# Patient Record
Sex: Male | Born: 1965 | Race: White | Hispanic: No | Marital: Married | Smoking: Never smoker
Health system: Southern US, Community
[De-identification: ages and names within clinical notes are randomized; demographics above are authoritative.]

## PROBLEM LIST (undated history)

## (undated) DIAGNOSIS — I10 Essential (primary) hypertension: Secondary | ICD-10-CM

## (undated) DIAGNOSIS — E119 Type 2 diabetes mellitus without complications: Secondary | ICD-10-CM

## (undated) DIAGNOSIS — E785 Hyperlipidemia, unspecified: Secondary | ICD-10-CM

## (undated) HISTORY — PX: OTHER SURGICAL HISTORY: SHX169

## (undated) HISTORY — DX: Essential (primary) hypertension: I10

## (undated) HISTORY — DX: Type 2 diabetes mellitus without complications: E11.9

## (undated) HISTORY — DX: Hyperlipidemia, unspecified: E78.5

---

## 1998-03-17 ENCOUNTER — Emergency Department (HOSPITAL_COMMUNITY): Admission: EM | Admit: 1998-03-17 | Discharge: 1998-03-17 | Payer: Self-pay | Admitting: Emergency Medicine

## 2020-06-17 ENCOUNTER — Observation Stay (HOSPITAL_COMMUNITY)
Admission: EM | Admit: 2020-06-17 | Discharge: 2020-06-19 | Disposition: A | Discharge status: Home / Self Care | Payer: PRIVATE HEALTH INSURANCE | Attending: Internal Medicine | Admitting: Internal Medicine

## 2020-06-17 ENCOUNTER — Emergency Department (HOSPITAL_COMMUNITY): Payer: PRIVATE HEALTH INSURANCE

## 2020-06-17 ENCOUNTER — Other Ambulatory Visit: Payer: Self-pay

## 2020-06-17 DIAGNOSIS — I6330 Cerebral infarction due to thrombosis of unspecified cerebral artery: Secondary | ICD-10-CM | POA: Insufficient documentation

## 2020-06-17 DIAGNOSIS — R29818 Other symptoms and signs involving the nervous system: Secondary | ICD-10-CM | POA: Diagnosis present

## 2020-06-17 DIAGNOSIS — R29810 Facial weakness: Secondary | ICD-10-CM

## 2020-06-17 DIAGNOSIS — R55 Syncope and collapse: Secondary | ICD-10-CM | POA: Diagnosis present

## 2020-06-17 DIAGNOSIS — Z794 Long term (current) use of insulin: Secondary | ICD-10-CM | POA: Diagnosis not present

## 2020-06-17 DIAGNOSIS — G459 Transient cerebral ischemic attack, unspecified: Secondary | ICD-10-CM | POA: Diagnosis not present

## 2020-06-17 DIAGNOSIS — Z20822 Contact with and (suspected) exposure to covid-19: Secondary | ICD-10-CM | POA: Insufficient documentation

## 2020-06-17 DIAGNOSIS — Z79899 Other long term (current) drug therapy: Secondary | ICD-10-CM | POA: Insufficient documentation

## 2020-06-17 DIAGNOSIS — R079 Chest pain, unspecified: Secondary | ICD-10-CM | POA: Diagnosis not present

## 2020-06-17 DIAGNOSIS — I10 Essential (primary) hypertension: Secondary | ICD-10-CM | POA: Insufficient documentation

## 2020-06-17 DIAGNOSIS — R072 Precordial pain: Secondary | ICD-10-CM | POA: Diagnosis not present

## 2020-06-17 DIAGNOSIS — R42 Dizziness and giddiness: Secondary | ICD-10-CM | POA: Diagnosis present

## 2020-06-17 DIAGNOSIS — R17 Unspecified jaundice: Secondary | ICD-10-CM | POA: Diagnosis present

## 2020-06-17 DIAGNOSIS — R531 Weakness: Secondary | ICD-10-CM

## 2020-06-17 DIAGNOSIS — E1165 Type 2 diabetes mellitus with hyperglycemia: Secondary | ICD-10-CM | POA: Diagnosis present

## 2020-06-17 LAB — CBC
HCT: 46.9 % (ref 39.0–52.0)
Hemoglobin: 15.4 g/dL (ref 13.0–17.0)
MCH: 30.6 pg (ref 26.0–34.0)
MCHC: 32.8 g/dL (ref 30.0–36.0)
MCV: 93.1 fL (ref 80.0–100.0)
Platelets: 244 10*3/uL (ref 150–400)
RBC: 5.04 MIL/uL (ref 4.22–5.81)
RDW: 12.2 % (ref 11.5–15.5)
WBC: 16.3 10*3/uL — ABNORMAL HIGH (ref 4.0–10.5)
nRBC: 0 % (ref 0.0–0.2)

## 2020-06-17 LAB — DIFFERENTIAL
Abs Immature Granulocytes: 0.07 10*3/uL (ref 0.00–0.07)
Basophils Absolute: 0.1 10*3/uL (ref 0.0–0.1)
Basophils Relative: 0 %
Eosinophils Absolute: 0.1 10*3/uL (ref 0.0–0.5)
Eosinophils Relative: 1 %
Immature Granulocytes: 0 %
Lymphocytes Relative: 8 %
Lymphs Abs: 1.3 10*3/uL (ref 0.7–4.0)
Monocytes Absolute: 0.9 10*3/uL (ref 0.1–1.0)
Monocytes Relative: 6 %
Neutro Abs: 13.9 10*3/uL — ABNORMAL HIGH (ref 1.7–7.7)
Neutrophils Relative %: 85 %

## 2020-06-17 LAB — I-STAT CHEM 8, ED
BUN: 19 mg/dL (ref 6–20)
Calcium, Ion: 1.19 mmol/L (ref 1.15–1.40)
Chloride: 101 mmol/L (ref 98–111)
Creatinine, Ser: 0.9 mg/dL (ref 0.61–1.24)
Glucose, Bld: 178 mg/dL — ABNORMAL HIGH (ref 70–99)
HCT: 45 % (ref 39.0–52.0)
Hemoglobin: 15.3 g/dL (ref 13.0–17.0)
Potassium: 3.9 mmol/L (ref 3.5–5.1)
Sodium: 138 mmol/L (ref 135–145)
TCO2: 25 mmol/L (ref 22–32)

## 2020-06-17 LAB — COMPREHENSIVE METABOLIC PANEL
ALT: 19 U/L (ref 0–44)
AST: 19 U/L (ref 15–41)
Albumin: 4.2 g/dL (ref 3.5–5.0)
Alkaline Phosphatase: 42 U/L (ref 38–126)
Anion gap: 10 (ref 5–15)
BUN: 16 mg/dL (ref 6–20)
CO2: 24 mmol/L (ref 22–32)
Calcium: 9.1 mg/dL (ref 8.9–10.3)
Chloride: 101 mmol/L (ref 98–111)
Creatinine, Ser: 0.95 mg/dL (ref 0.61–1.24)
GFR, Estimated: >60 mL/min (ref >60)
Glucose, Bld: 183 mg/dL — ABNORMAL HIGH (ref 70–99)
Potassium: 3.9 mmol/L (ref 3.5–5.1)
Sodium: 135 mmol/L (ref 135–145)
Total Bilirubin: 2.1 mg/dL — ABNORMAL HIGH (ref 0.3–1.2)
Total Protein: 7 g/dL (ref 6.5–8.1)

## 2020-06-17 LAB — APTT: aPTT: 21 seconds — ABNORMAL LOW (ref 24–36)

## 2020-06-17 LAB — TROPONIN I (HIGH SENSITIVITY): Troponin I (High Sensitivity): 4 ng/L (ref <18)

## 2020-06-17 LAB — PROTIME-INR
INR: 1 (ref 0.8–1.2)
Prothrombin Time: 13.5 seconds (ref 11.4–15.2)

## 2020-06-17 LAB — D-DIMER, QUANTITATIVE: D-Dimer, Quant: 0.36 ug/mL-FEU (ref 0.00–0.50)

## 2020-06-17 LAB — CBG MONITORING, ED: Glucose-Capillary: 168 mg/dL — ABNORMAL HIGH (ref 70–99)

## 2020-06-17 MED ORDER — IOHEXOL 350 MG/ML SOLN
75.0000 mL | Freq: Once | INTRAVENOUS | Status: AC | PRN
  Administered 2020-06-17: 75 mL via INTRAVENOUS

## 2020-06-17 MED ORDER — SODIUM CHLORIDE 0.9 % IV SOLN: Freq: Once | INTRAVENOUS | Status: AC

## 2020-06-17 MED ORDER — SODIUM CHLORIDE 0.9% FLUSH: 3.0000 mL | Freq: Once | INTRAVENOUS | Status: DC

## 2020-06-17 MED ORDER — SODIUM CHLORIDE 0.9 % IV SOLN
25.0000 mg | Freq: Once | INTRAVENOUS | Status: AC
  Administered 2020-06-17: 25 mg via INTRAVENOUS
  Filled 2020-06-17: qty 1

## 2020-06-17 NOTE — Code Documentation (Signed)
Responded to Code Stroke called at 2032 for L facial droop, LSN-2000. Pt arrived at 2038 c/o SOB/HA/nausea, CBG-168, NIH-1, CT head negative for acute changes. Pt transported to ED room for workup.

## 2020-06-17 NOTE — ED Provider Notes (Incomplete)
[facility] EMERGENCY DEPARTMENT Provider Note   CSN: [id number] Arrival date & time: 06/17/20  2038  An emergency department physician performed an initial assessment on this suspected stroke patient at 2041.  History Chief Complaint  Patient presents with  . Code Stroke    Eric Bauer is a 55 y.o. male.  Patient with h/o DM Type 2, hypertension, high cholesterol -- presents by EMS from a restaurant as a Code Stroke. Pt had abrupt onset of fatigue, lightheadedness. This was associated with nausea without vomiting, headache, shortness of breath, and chest pain with breathing. EMS was called. They noted left-sided facial droop, prompting code stroke. Pt met by myself and neuro on arrival to ED. Head CT was negative for bleed. No treatments other than IV fluids PTA. Patient denies risk factors for pulmonary embolism or DVT including: unilateral leg swelling, history of DVT/PE/other blood clots, use of exogenous hormones, recent immobilizations, recent surgery, recent travel (>4hr segment), malignancy, hemoptysis. No h/o cardiac disease.          No past medical history on file.  There are no problems to display for this patient.   *** The histories are not reviewed yet. Please review them in the "History" navigator section and refresh this [place].     No family history on file.     Home Medications Prior to Admission medications   Not on File    Allergies    Patient has no allergy information on record.  Review of Systems   Review of Systems  Constitutional: Positive for fatigue. Negative for diaphoresis and fever.  Eyes: Negative for redness.  Respiratory: Positive for shortness of breath. Negative for cough.   Cardiovascular: Positive for chest pain. Negative for palpitations and leg swelling.  Gastrointestinal: Positive for nausea. Negative for abdominal pain and vomiting.  Genitourinary: Negative for dysuria.  Musculoskeletal: Negative  for back pain and neck pain.  Skin: Negative for rash.  Neurological: Positive for dizziness, facial asymmetry, light-headedness and headaches. Negative for syncope.  Psychiatric/Behavioral: The patient is not nervous/anxious.     Physical Exam Updated Vital Signs BP 124/86   Pulse (!) 109   Temp 98.4 F (36.9 C) (Oral)   Resp (!) 25   Ht '5\' 8"'  (1.727 m)   Wt 91.8 kg   SpO2 99%   BMI 30.77 kg/m   Physical Exam Vitals and nursing note reviewed.  Constitutional:      Appearance: He is well-developed. He is not diaphoretic.  HENT:     Head: Normocephalic and atraumatic.     Right Ear: Tympanic membrane, ear canal and external ear normal.     Left Ear: Tympanic membrane, ear canal and external ear normal.     Nose: Nose normal.     Mouth/Throat:     Mouth: Mucous membranes are not dry.     Pharynx: Uvula midline.  Eyes:     General: Lids are normal.     Conjunctiva/sclera: Conjunctivae normal.     Pupils: Pupils are equal, round, and reactive to light.  Neck:     Vascular: Normal carotid pulses. No carotid bruit or JVD.     Trachea: Trachea normal. No tracheal deviation.  Cardiovascular:     Rate and Rhythm: Regular rhythm. Tachycardia present.     Pulses: No decreased pulses.     Heart sounds: Normal heart sounds, S1 normal and S2 normal. Heart sounds not distant. No murmur heard.     Comments: HR 100-105 Pulmonary:  Effort: Pulmonary effort is normal. No respiratory distress.     Breath sounds: Normal breath sounds. No wheezing.  Chest:     Chest wall: No tenderness.  Abdominal:     General: Bowel sounds are normal.     Palpations: Abdomen is soft.     Tenderness: There is no abdominal tenderness. There is no guarding or rebound.  Musculoskeletal:        General: Normal range of motion.     Cervical back: Normal range of motion and neck supple. No tenderness or bony tenderness. No muscular tenderness.     Right lower leg: No edema.     Left lower leg: No  edema.     Comments: No calf/thigh tenderness or swelling.   Skin:    General: Skin is warm and dry.     Coloration: Skin is not pale.  Neurological:     Mental Status: He is alert and oriented to person, place, and time.     GCS: GCS eye subscore is 4. GCS verbal subscore is 5. GCS motor subscore is 6.     Cranial Nerves: Cranial nerves are intact. No cranial nerve deficit, dysarthria or facial asymmetry.     Sensory: No sensory deficit.     Motor: Weakness (generalized, no focal unilateral weakness) present. No abnormal muscle tone.     Coordination: Coordination is intact. Romberg sign negative. Coordination normal.     ED Results / Procedures / Treatments   Labs (all labs ordered are listed, but only abnormal results are displayed) Labs Reviewed  APTT - Abnormal; Notable for the following components:      Result Value   aPTT 21 (*)    All other components within normal limits  CBC - Abnormal; Notable for the following components:   WBC 16.3 (*)    All other components within normal limits  DIFFERENTIAL - Abnormal; Notable for the following components:   Neutro Abs 13.9 (*)    All other components within normal limits  COMPREHENSIVE METABOLIC PANEL - Abnormal; Notable for the following components:   Glucose, Bld 183 (*)    Total Bilirubin 2.1 (*)    All other components within normal limits  I-STAT CHEM 8, ED - Abnormal; Notable for the following components:   Glucose, Bld 178 (*)    All other components within normal limits  CBG MONITORING, ED - Abnormal; Notable for the following components:   Glucose-Capillary 168 (*)    All other components within normal limits  PROTIME-INR  D-DIMER, QUANTITATIVE  URINALYSIS, ROUTINE W REFLEX MICROSCOPIC  TROPONIN I (HIGH SENSITIVITY)    EKG EKG Interpretation  Date/Time:  Saturday June 17 2020 20:59:46 EDT Ventricular Rate:  92 PR Interval:  146 QRS Duration: 86 QT Interval:  339 QTC Calculation: 420 R Axis:   55 Text  Interpretation: Sinus rhythm ST elev, probable normal early repol pattern Confirmed by Thamas Jaegers ([id number]) on 06/17/2020 9:53:13 PM   Radiology DG Chest Port 1 View  Result Date: 06/17/2020 CLINICAL DATA:  Shortness of breath EXAM: PORTABLE CHEST 1 VIEW COMPARISON:  None. FINDINGS: The heart size and mediastinal contours are within normal limits. Low lung volumes with bibasilar atelectasis. No focal consolidation. No visible pleural effusion or pneumothorax. The visualized skeletal structures are unremarkable. IMPRESSION: Low lung volumes with bibasilar atelectasis. No focal consolidation. Electronically Signed   By: Dahlia Bailiff MD   On: 06/17/2020 21:44   CT HEAD CODE STROKE WO CONTRAST  Result Date: 06/17/2020 CLINICAL  DATA:  Code stroke. EXAM: CT HEAD WITHOUT CONTRAST TECHNIQUE: Contiguous axial images were obtained from the base of the skull through the vertex without intravenous contrast. COMPARISON:  Head CT 07/18/2019 FINDINGS: Brain: There is no mass, hemorrhage or extra-axial collection. The size and configuration of the ventricles and extra-axial CSF spaces are normal. The brain parenchyma is normal, without evidence of acute or chronic infarction. Vascular: No abnormal hyperdensity of the major intracranial arteries or dural venous sinuses. No intracranial atherosclerosis. Skull: The visualized skull base, calvarium and extracranial soft tissues are normal. Sinuses/Orbits: No fluid levels or advanced mucosal thickening of the visualized paranasal sinuses. No mastoid or middle ear effusion. The orbits are normal. ASPECTS [facility] Stroke Program Early CT Score) - Ganglionic level infarction (caudate, lentiform nuclei, internal capsule, insula, M1-M3 cortex): 7 - Supraganglionic infarction (M4-M6 cortex): 3 Total score (0-10 with 10 being normal): 10 IMPRESSION: 1. Normal head CT. 2. ASPECTS is 10. These results were communicated to Dr. Kerney Elbe at 8:59 pm on 06/17/2020 by text page via the  [facility] messaging system. Electronically Signed   By: Ulyses Jarred M.D.   On: 06/17/2020 21:00    Procedures Procedures {Remember to document critical care time when appropriate:1}  Medications Ordered in ED Medications  sodium chloride flush (NS) 0.9 % injection 3 mL (3 mLs Intravenous Not Given 06/17/20 2106)  promethazine (PHENERGAN) 25 mg in sodium chloride 0.9 % 50 mL IVPB (0 mg Intravenous Stopped 06/17/20 2227)  0.9 %  sodium chloride infusion ( Intravenous New Bag/Given 06/17/20 2126)  iohexol (OMNIPAQUE) 350 MG/ML injection 75 mL (75 mLs Intravenous Contrast Given 06/17/20 2340)    ED Course  I have reviewed the triage vital signs and the nursing notes.  Pertinent labs & imaging results that were available during my care of the patient were reviewed by me and considered in my medical decision making (see chart for details).  Patient seen and examined. Work-up initiated.    Vital signs reviewed and are as follows: BP 124/86   Pulse (!) 109   Temp 98.4 F (36.9 C) (Oral)   Resp (!) 25   Ht '5\' 8"'  (1.727 m)   Wt 91.8 kg   SpO2 99%   BMI 30.77 kg/m   EKG reviewed, no STEMI.   11:11 PM Pt stable. Continues to c/o pain with deep breathing and mild SOB. D-dimer added and was negative. Awaiting troponin.   Neuro reccs admission for TIA work-up given risk factors.   12:02 AM Troponin is 4. Will request consult for admission.     MDM Rules/Calculators/A&P                          Admit for TIA/CP eval.   Final Clinical Impression(s) / ED Diagnoses Final diagnoses:  Facial droop  Precordial pain  Generalized weakness    Rx / DC Orders ED Discharge Orders    None

## 2020-06-17 NOTE — ED Triage Notes (Signed)
[place] [facility] [place] [facility]. Felt fatigue with near syncope. No trauma. Started having nausea, SOB, and headache. BP 109/68 fluids given. L sided facial droop noticed. NO blood thinners, no hx of stroke.   132/78 98%  96 heart rate

## 2020-06-17 NOTE — ED Provider Notes (Signed)
[facility] EMERGENCY DEPARTMENT Provider Note   CSN: [id number] Arrival date & time: 06/17/20  2038  An emergency department physician performed an initial assessment on this suspected stroke patient at 2041.  History Chief Complaint  Patient presents with  . Code Stroke    Eric Bauer is a 55 y.o. male.  Patient with h/o DM Type 2, hypertension, high cholesterol -- presents by EMS from a restaurant as a Code Stroke. Pt had abrupt onset of fatigue, lightheadedness. This was associated with nausea without vomiting, headache, shortness of breath, and chest pain with breathing. EMS was called. They noted left-sided facial droop, prompting code stroke. Pt met by myself and neuro on arrival to ED. Head CT was negative for bleed. No treatments other than IV fluids PTA. Patient denies risk factors for pulmonary embolism or DVT including: unilateral leg swelling, history of DVT/PE/other blood clots, use of exogenous hormones, recent immobilizations, recent surgery, recent travel (>4hr segment), malignancy, hemoptysis. No h/o cardiac disease.          No past medical history on file.  There are no problems to display for this patient.   The histories are not reviewed yet. Please review them in the "History" navigator section and refresh this [place].     No family history on file.     Home Medications Prior to Admission medications   Not on File    Allergies    Patient has no allergy information on record.  Review of Systems   Review of Systems  Constitutional: Positive for fatigue. Negative for diaphoresis and fever.  Eyes: Negative for redness.  Respiratory: Positive for shortness of breath. Negative for cough.   Cardiovascular: Positive for chest pain. Negative for palpitations and leg swelling.  Gastrointestinal: Positive for nausea. Negative for abdominal pain and vomiting.  Genitourinary: Negative for dysuria.  Musculoskeletal: Negative for  back pain and neck pain.  Skin: Negative for rash.  Neurological: Positive for dizziness, facial asymmetry, light-headedness and headaches. Negative for syncope.  Psychiatric/Behavioral: The patient is not nervous/anxious.     Physical Exam Updated Vital Signs BP 124/86   Pulse (!) 109   Temp 98.4 F (36.9 C) (Oral)   Resp (!) 25   Ht _0  (1.727 m)   Wt 91.8 kg   SpO2 99%   BMI 30.77 kg/m   Physical Exam Vitals and nursing note reviewed.  Constitutional:      Appearance: He is well-developed. He is not diaphoretic.  HENT:     Head: Normocephalic and atraumatic.     Right Ear: Tympanic membrane, ear canal and external ear normal.     Left Ear: Tympanic membrane, ear canal and external ear normal.     Nose: Nose normal.     Mouth/Throat:     Mouth: Mucous membranes are not dry.     Pharynx: Uvula midline.  Eyes:     General: Lids are normal.     Conjunctiva/sclera: Conjunctivae normal.     Pupils: Pupils are equal, round, and reactive to light.  Neck:     Vascular: Normal carotid pulses. No carotid bruit or JVD.     Trachea: Trachea normal. No tracheal deviation.  Cardiovascular:     Rate and Rhythm: Regular rhythm. Tachycardia present.     Pulses: No decreased pulses.     Heart sounds: Normal heart sounds, S1 normal and S2 normal. Heart sounds not distant. No murmur heard.     Comments: HR 100-105 Pulmonary:  Effort: Pulmonary effort is normal. No respiratory distress.     Breath sounds: Normal breath sounds. No wheezing.  Chest:     Chest wall: No tenderness.  Abdominal:     General: Bowel sounds are normal.     Palpations: Abdomen is soft.     Tenderness: There is no abdominal tenderness. There is no guarding or rebound.  Musculoskeletal:        General: Normal range of motion.     Cervical back: Normal range of motion and neck supple. No tenderness or bony tenderness. No muscular tenderness.     Right lower leg: No edema.     Left lower leg: No edema.      Comments: No calf/thigh tenderness or swelling.   Skin:    General: Skin is warm and dry.     Coloration: Skin is not pale.  Neurological:     Mental Status: He is alert and oriented to person, place, and time.     GCS: GCS eye subscore is 4. GCS verbal subscore is 5. GCS motor subscore is 6.     Cranial Nerves: Cranial nerves are intact. No cranial nerve deficit, dysarthria or facial asymmetry.     Sensory: No sensory deficit.     Motor: Weakness (generalized, no focal unilateral weakness) present. No abnormal muscle tone.     Coordination: Coordination is intact. Romberg sign negative. Coordination normal.     ED Results / Procedures / Treatments   Labs (all labs ordered are listed, but only abnormal results are displayed) Labs Reviewed  APTT - Abnormal; Notable for the following components:      Result Value   aPTT 21 (*)    All other components within normal limits  CBC - Abnormal; Notable for the following components:   WBC 16.3 (*)    All other components within normal limits  DIFFERENTIAL - Abnormal; Notable for the following components:   Neutro Abs 13.9 (*)    All other components within normal limits  COMPREHENSIVE METABOLIC PANEL - Abnormal; Notable for the following components:   Glucose, Bld 183 (*)    Total Bilirubin 2.1 (*)    All other components within normal limits  I-STAT CHEM 8, ED - Abnormal; Notable for the following components:   Glucose, Bld 178 (*)    All other components within normal limits  CBG MONITORING, ED - Abnormal; Notable for the following components:   Glucose-Capillary 168 (*)    All other components within normal limits  PROTIME-INR  D-DIMER, QUANTITATIVE  URINALYSIS, ROUTINE W REFLEX MICROSCOPIC  TROPONIN I (HIGH SENSITIVITY)    EKG EKG Interpretation  Date/Time:  Saturday June 17 2020 20:59:46 EDT Ventricular Rate:  92 PR Interval:  146 QRS Duration: 86 QT Interval:  339 QTC Calculation: 420 R Axis:   55 Text  Interpretation: Sinus rhythm ST elev, probable normal early repol pattern Confirmed by Thamas Jaegers ([id number]) on 06/17/2020 9:53:13 PM   Radiology CT Angio Head W or Wo Contrast  Result Date: 06/17/2020 CLINICAL DATA:  Stroke/TIA, assess intracranial arteries; Stroke/TIA, assess intracranial arteries Stroke/TIA, assess extracranial arteries EXAM: CT ANGIOGRAPHY HEAD AND NECK TECHNIQUE: Multidetector CT imaging of the head and neck was performed using the standard protocol during bolus administration of intravenous contrast. Multiplanar CT image reconstructions and MIPs were obtained to evaluate the vascular anatomy. Carotid stenosis measurements (when applicable) are obtained utilizing NASCET criteria, using the distal internal carotid diameter as the denominator. CONTRAST:  91m OMNIPAQUE IOHEXOL 350 MG/ML  SOLN COMPARISON:  None. FINDINGS: CTA NECK FINDINGS SKELETON: There is no bony spinal canal stenosis. No lytic or blastic lesion. OTHER NECK: Normal pharynx, larynx and major salivary glands. No cervical lymphadenopathy. Unremarkable thyroid gland. UPPER CHEST: No pneumothorax or pleural effusion. No nodules or masses. AORTIC ARCH: There is no calcific atherosclerosis of the aortic arch. There is no aneurysm, dissection or hemodynamically significant stenosis of the visualized portion of the aorta. Conventional 3 vessel aortic branching pattern. The visualized proximal subclavian arteries are widely patent. RIGHT CAROTID SYSTEM: Normal without aneurysm, dissection or stenosis. LEFT CAROTID SYSTEM: Normal without aneurysm, dissection or stenosis. VERTEBRAL ARTERIES: Left dominant configuration. Both origins are clearly patent. There is no dissection, occlusion or flow-limiting stenosis to the skull base (V1-V3 segments). CTA HEAD FINDINGS POSTERIOR CIRCULATION: --Vertebral arteries: Normal V4 segments. --Inferior cerebellar arteries: Normal. --Basilar artery: Normal. --Superior cerebellar arteries: Normal.  --Posterior cerebral arteries (PCA): Normal. The right PCA is partially supplied by a posterior communicating artery (p-comm). ANTERIOR CIRCULATION: --Intracranial internal carotid arteries: Normal. --Anterior cerebral arteries (ACA): Normal. Both A1 segments are present. Patent anterior communicating artery (a-comm). --Middle cerebral arteries (MCA): Normal. VENOUS SINUSES: As permitted by contrast timing, patent. ANATOMIC VARIANTS: None Review of the MIP images confirms the above findings. IMPRESSION: Normal CTA of the head and neck. Electronically Signed   By: Ulyses Jarred M.D.   On: 06/17/2020 23:55   CT Angio Neck W and/or Wo Contrast  Result Date: 06/17/2020 CLINICAL DATA:  Stroke/TIA, assess intracranial arteries; Stroke/TIA, assess intracranial arteries Stroke/TIA, assess extracranial arteries EXAM: CT ANGIOGRAPHY HEAD AND NECK TECHNIQUE: Multidetector CT imaging of the head and neck was performed using the standard protocol during bolus administration of intravenous contrast. Multiplanar CT image reconstructions and MIPs were obtained to evaluate the vascular anatomy. Carotid stenosis measurements (when applicable) are obtained utilizing NASCET criteria, using the distal internal carotid diameter as the denominator. CONTRAST:  51m OMNIPAQUE IOHEXOL 350 MG/ML SOLN COMPARISON:  None. FINDINGS: CTA NECK FINDINGS SKELETON: There is no bony spinal canal stenosis. No lytic or blastic lesion. OTHER NECK: Normal pharynx, larynx and major salivary glands. No cervical lymphadenopathy. Unremarkable thyroid gland. UPPER CHEST: No pneumothorax or pleural effusion. No nodules or masses. AORTIC ARCH: There is no calcific atherosclerosis of the aortic arch. There is no aneurysm, dissection or hemodynamically significant stenosis of the visualized portion of the aorta. Conventional 3 vessel aortic branching pattern. The visualized proximal subclavian arteries are widely patent. RIGHT CAROTID SYSTEM: Normal without  aneurysm, dissection or stenosis. LEFT CAROTID SYSTEM: Normal without aneurysm, dissection or stenosis. VERTEBRAL ARTERIES: Left dominant configuration. Both origins are clearly patent. There is no dissection, occlusion or flow-limiting stenosis to the skull base (V1-V3 segments). CTA HEAD FINDINGS POSTERIOR CIRCULATION: --Vertebral arteries: Normal V4 segments. --Inferior cerebellar arteries: Normal. --Basilar artery: Normal. --Superior cerebellar arteries: Normal. --Posterior cerebral arteries (PCA): Normal. The right PCA is partially supplied by a posterior communicating artery (p-comm). ANTERIOR CIRCULATION: --Intracranial internal carotid arteries: Normal. --Anterior cerebral arteries (ACA): Normal. Both A1 segments are present. Patent anterior communicating artery (a-comm). --Middle cerebral arteries (MCA): Normal. VENOUS SINUSES: As permitted by contrast timing, patent. ANATOMIC VARIANTS: None Review of the MIP images confirms the above findings. IMPRESSION: Normal CTA of the head and neck. Electronically Signed   By: KUlyses JarredM.D.   On: 06/17/2020 23:55   DG Chest Port 1 View  Result Date: 06/17/2020 CLINICAL DATA:  Shortness of breath EXAM: PORTABLE CHEST 1 VIEW COMPARISON:  None. FINDINGS: The heart size and  mediastinal contours are within normal limits. Low lung volumes with bibasilar atelectasis. No focal consolidation. No visible pleural effusion or pneumothorax. The visualized skeletal structures are unremarkable. IMPRESSION: Low lung volumes with bibasilar atelectasis. No focal consolidation. Electronically Signed   By: Dahlia Bailiff MD   On: 06/17/2020 21:44   CT HEAD CODE STROKE WO CONTRAST  Result Date: 06/17/2020 CLINICAL DATA:  Code stroke. EXAM: CT HEAD WITHOUT CONTRAST TECHNIQUE: Contiguous axial images were obtained from the base of the skull through the vertex without intravenous contrast. COMPARISON:  Head CT 07/18/2019 FINDINGS: Brain: There is no mass, hemorrhage or  extra-axial collection. The size and configuration of the ventricles and extra-axial CSF spaces are normal. The brain parenchyma is normal, without evidence of acute or chronic infarction. Vascular: No abnormal hyperdensity of the major intracranial arteries or dural venous sinuses. No intracranial atherosclerosis. Skull: The visualized skull base, calvarium and extracranial soft tissues are normal. Sinuses/Orbits: No fluid levels or advanced mucosal thickening of the visualized paranasal sinuses. No mastoid or middle ear effusion. The orbits are normal. ASPECTS [facility] Stroke Program Early CT Score) - Ganglionic level infarction (caudate, lentiform nuclei, internal capsule, insula, M1-M3 cortex): 7 - Supraganglionic infarction (M4-M6 cortex): 3 Total score (0-10 with 10 being normal): 10 IMPRESSION: 1. Normal head CT. 2. ASPECTS is 10. These results were communicated to Dr. Kerney Elbe at 8:59 pm on 06/17/2020 by text page via the [facility] messaging system. Electronically Signed   By: Ulyses Jarred M.D.   On: 06/17/2020 21:00    Procedures Procedures   Medications Ordered in ED Medications  sodium chloride flush (NS) 0.9 % injection 3 mL (3 mLs Intravenous Not Given 06/17/20 2106)  promethazine (PHENERGAN) 25 mg in sodium chloride 0.9 % 50 mL IVPB (0 mg Intravenous Stopped 06/17/20 2227)  0.9 %  sodium chloride infusion ( Intravenous New Bag/Given 06/17/20 2126)  iohexol (OMNIPAQUE) 350 MG/ML injection 75 mL (75 mLs Intravenous Contrast Given 06/17/20 2340)    ED Course  I have reviewed the triage vital signs and the nursing notes.  Pertinent labs & imaging results that were available during my care of the patient were reviewed by me and considered in my medical decision making (see chart for details).  Patient seen and examined. Work-up initiated.    Vital signs reviewed and are as follows: BP 124/86   Pulse (!) 109   Temp 98.4 F (36.9 C) (Oral)   Resp (!) 25   Ht _0  (1.727 m)   Wt 91.8  kg   SpO2 99%   BMI 30.77 kg/m   EKG reviewed, no STEMI.   11:11 PM Pt stable. Continues to c/o pain with deep breathing and mild SOB. D-dimer added and was negative. Awaiting troponin.   Neuro reccs admission for TIA work-up given risk factors.   12:02 AM Troponin is 4. Will request consult for admission.   12:22 AM Spoke with Dr. Myna Hidalgo who will see.     MDM Rules/Calculators/A&P                          Admit for TIA/CP eval.   Final Clinical Impression(s) / ED Diagnoses Final diagnoses:  Facial droop  Precordial pain  Generalized weakness    Rx / DC Orders ED Discharge Orders    None       Carlisle Cater, Hershal Coria 06/18/20 0023    Luna Fuse, MD 06/18/20 1511

## 2020-06-17 NOTE — ED Notes (Signed)
Pt placed on 2L [place] for comfort.

## 2020-06-17 NOTE — ED Notes (Signed)
Per neurology verbal order for troponin add on and let EDP know.

## 2020-06-17 NOTE — ED Notes (Signed)
Called lab to add on d-dimer.  

## 2020-06-17 NOTE — ED Notes (Signed)
Called lab to add on troponin  

## 2020-06-17 NOTE — Consult Note (Signed)
NEURO HOSPITALIST CONSULT NOTE   Requestig physician: Dr. Antionette Char  Reason for Consult: Acute onset of near-syncope with left sided facial droop  History obtained from:  EMS, Patient and Chart     HPI:                                                                                                                                          Eric Bauer is an 55 y.o. male with a PMHx of DM and HTN, presenting acutely from [facility] [facility] via EMS after having a near-syncopal episode in conjunction with diaphoresis and upper extremity tremor prior to eating. He also started to experience nausea, SOB and H/A. BP per EMS was 109/68 and 300 mL fluids given. CBG was 170. HR 97. During their evaluation, EMS noted a left sided facial droop and Code Stroke was called in the field. The patient has no history of stroke. He has had an episode similar to this in the past. On arrival to the ED, BP was 132/78, HR 96 and O2 sats 98% on RA.    No past medical history on file.   No family history on file.           Social History:  has no history on file for tobacco use, alcohol use, and drug use.  Not on File  MEDICATIONS:                                                                                                                     No current facility-administered medications on file prior to encounter.   Current Outpatient Medications on File Prior to Encounter  Medication Sig Dispense Refill  . famotidine (PEPCID) 40 MG tablet Take 40 mg by mouth daily.    Marland Kitchen JANUMET 50-1000 MG tablet Take 1 tablet by mouth 2 (two) times daily with a meal.    . lisinopril (ZESTRIL) 5 MG tablet Take 5 mg by mouth daily.    . rosuvastatin (CRESTOR) 40 MG tablet Take 40 mg by mouth daily.    . tamsulosin (FLOMAX) 0.4 MG CAPS capsule Take 0.8 mg by mouth daily.    [name] FLEXTOUCH 100 UNIT/ML FlexTouch Pen Inject 46 Units into the skin at bedtime.       ROS:  The patient is a poor historian and cannot provide a comprehensive ROS outside of his immediate complaints, as described in the HPI.   Weight 91.8 kg. BP 127/88   Pulse (!) 112   Temp 98.4 F (36.9 C) (Oral)   Resp 18   Ht 5\' 8"  (1.727 m)   Wt 91.8 kg   SpO2 97%   BMI 30.77 kg/m    General Examination:                                                                                                       Physical Exam  HEENT-  [place]/AT    Lungs- Respirations unlabored Extremities- No edema  Neurological Examination Mental Status: Awake but mildly drowsy. Alert, but with decreased attention. Speech fluent with intact comprehension. Able to name a thumb and pinky finger, but not a forefinger. Oriented to city, state, day of the week, year and month.  Cranial Nerves: II: Temporal visual fields intact with no extinction to DSS. PERRL.   III,IV, VI: No ptosis. EOMI. No nystagmus.  V,VII: Smile symmetric, facial temp sensation equal bilaterally VIII: Hearing intact to voice IX,X: No hypophonia XI: Symmetric shoulder shrug XII: Midline tongue extension Motor: LUE and LLE with giveway weakness; maximum strength elicitable is 4+/5 RUE and RLE 5/5 Sensory: Temp and FT intact x 4. No extinction to DSS.  Deep Tendon Reflexes: 2+ and symmetric bilateral brachioradialis and biceps. 3+ bilateral patellae. 1+ bilateral achilles. Toes downgoing bilaterally.  Cerebellar: No ataxia with FNF bilaterally. Action tremor is noted.  Gait: Deferred    Lab Results: Basic Metabolic Panel: No results for input(s): NA, K, CL, CO2, GLUCOSE, BUN, CREATININE, CALCIUM, MG, PHOS in the last 168 hours.  CBC: No results for input(s): WBC, NEUTROABS, HGB, HCT, MCV, PLT in the last 168 hours.  Cardiac Enzymes: No results for input(s): CKTOTAL, CKMB, CKMBINDEX, TROPONINI in the last 168 hours.  Lipid  Panel: No results for input(s): CHOL, TRIG, HDL, CHOLHDL, VLDL, LDLCALC in the last 168 hours.  Imaging: No results found.  Assessment: 55 year old male presenting with acute onset of near-syncope with left sided facial droop 1. Neurological examination reveals giveway weakness on the left. No facial droop noted. DDx includes acute lacunar infarction, right sided cerebral hypoperfusion in the context of near-syncope and low BP on scene, MI (has chest pain with left shoulder pain as well as SOB) and psychogenic pseudostroke. In the context of the wide DDx, potential benefits of IV tPA are significantly outweighed by risks.  2. CT head: Normal 3. CTA of head and neck: Normal 4. Elevated WBC of 16.3.   Recommendations: 1. MRI brain 2. TTE 3. Cardiac telemetry 4. BP monitoring.  5. IVF 6. Permissive HTN x 24 hours 7. Evaluation for the underlying etiology of the patient's leukocytosis 8. ASA 325 mg po x 1, followed by 81 mg po qd 9. PT/OT/Speech 10. Orthostatics. 11. ED evaluation for possible MI.   Electronically signed: Dr. 40 06/17/2020, 8:50 PM

## 2020-06-18 ENCOUNTER — Encounter (HOSPITAL_COMMUNITY): Payer: Self-pay | Admitting: Family Medicine

## 2020-06-18 ENCOUNTER — Observation Stay (HOSPITAL_COMMUNITY): Payer: PRIVATE HEALTH INSURANCE

## 2020-06-18 ENCOUNTER — Observation Stay (HOSPITAL_BASED_OUTPATIENT_CLINIC_OR_DEPARTMENT_OTHER): Payer: PRIVATE HEALTH INSURANCE

## 2020-06-18 DIAGNOSIS — R079 Chest pain, unspecified: Secondary | ICD-10-CM | POA: Diagnosis not present

## 2020-06-18 DIAGNOSIS — R55 Syncope and collapse: Secondary | ICD-10-CM | POA: Diagnosis not present

## 2020-06-18 DIAGNOSIS — G459 Transient cerebral ischemic attack, unspecified: Secondary | ICD-10-CM | POA: Diagnosis not present

## 2020-06-18 DIAGNOSIS — E1165 Type 2 diabetes mellitus with hyperglycemia: Secondary | ICD-10-CM | POA: Diagnosis present

## 2020-06-18 DIAGNOSIS — R17 Unspecified jaundice: Secondary | ICD-10-CM | POA: Diagnosis not present

## 2020-06-18 DIAGNOSIS — I6330 Cerebral infarction due to thrombosis of unspecified cerebral artery: Secondary | ICD-10-CM | POA: Insufficient documentation

## 2020-06-18 DIAGNOSIS — R29818 Other symptoms and signs involving the nervous system: Secondary | ICD-10-CM

## 2020-06-18 LAB — ECHOCARDIOGRAM COMPLETE
Area-P 1/2: 5.54 cm2
Height: 68 in
S' Lateral: 2.7 cm
Weight: 3238.12 oz

## 2020-06-18 LAB — COMPREHENSIVE METABOLIC PANEL
ALT: 17 U/L (ref 0–44)
AST: 18 U/L (ref 15–41)
Albumin: 3.7 g/dL (ref 3.5–5.0)
Alkaline Phosphatase: 38 U/L (ref 38–126)
Anion gap: 9 (ref 5–15)
BUN: 15 mg/dL (ref 6–20)
CO2: 22 mmol/L (ref 22–32)
Calcium: 8.6 mg/dL — ABNORMAL LOW (ref 8.9–10.3)
Chloride: 104 mmol/L (ref 98–111)
Creatinine, Ser: 0.92 mg/dL (ref 0.61–1.24)
GFR, Estimated: >60 mL/min (ref >60)
Glucose, Bld: 196 mg/dL — ABNORMAL HIGH (ref 70–99)
Potassium: 3.9 mmol/L (ref 3.5–5.1)
Sodium: 135 mmol/L (ref 135–145)
Total Bilirubin: 2.1 mg/dL — ABNORMAL HIGH (ref 0.3–1.2)
Total Protein: 6.5 g/dL (ref 6.5–8.1)

## 2020-06-18 LAB — SARS CORONAVIRUS 2 (TAT 6-24 HRS): SARS Coronavirus 2: NEGATIVE

## 2020-06-18 LAB — URINALYSIS, ROUTINE W REFLEX MICROSCOPIC
Bilirubin Urine: NEGATIVE
Glucose, UA: 50 mg/dL — AB
Hgb urine dipstick: NEGATIVE
Ketones, ur: 5 mg/dL — AB
Leukocytes,Ua: NEGATIVE
Nitrite: NEGATIVE
Protein, ur: NEGATIVE mg/dL
Specific Gravity, Urine: 1.034 — ABNORMAL HIGH (ref 1.005–1.030)
pH: 6 (ref 5.0–8.0)

## 2020-06-18 LAB — LIPID PANEL
Cholesterol: 93 mg/dL (ref 0–200)
HDL: 34 mg/dL — ABNORMAL LOW (ref >40)
LDL Cholesterol: 47 mg/dL (ref 0–99)
Total CHOL/HDL Ratio: 2.7 RATIO
Triglycerides: 62 mg/dL (ref <150)
VLDL: 12 mg/dL (ref 0–40)

## 2020-06-18 LAB — GLUCOSE, CAPILLARY
Glucose-Capillary: 160 mg/dL — ABNORMAL HIGH (ref 70–99)
Glucose-Capillary: 137 mg/dL — ABNORMAL HIGH (ref 70–99)
Glucose-Capillary: 182 mg/dL — ABNORMAL HIGH (ref 70–99)
Glucose-Capillary: 127 mg/dL — ABNORMAL HIGH (ref 70–99)

## 2020-06-18 LAB — HIV ANTIBODY (ROUTINE TESTING W REFLEX): HIV Screen 4th Generation wRfx: NONREACTIVE

## 2020-06-18 LAB — CBC
HCT: 44.7 % (ref 39.0–52.0)
Hemoglobin: 15 g/dL (ref 13.0–17.0)
MCH: 30.9 pg (ref 26.0–34.0)
MCHC: 33.6 g/dL (ref 30.0–36.0)
MCV: 92 fL (ref 80.0–100.0)
Platelets: 225 10*3/uL (ref 150–400)
RBC: 4.86 MIL/uL (ref 4.22–5.81)
RDW: 12.2 % (ref 11.5–15.5)
WBC: 15.9 10*3/uL — ABNORMAL HIGH (ref 4.0–10.5)
nRBC: 0 % (ref 0.0–0.2)

## 2020-06-18 LAB — HEMOGLOBIN A1C
Hgb A1c MFr Bld: 9.3 % — ABNORMAL HIGH (ref 4.8–5.6)
Mean Plasma Glucose: 220.21 mg/dL

## 2020-06-18 LAB — TROPONIN I (HIGH SENSITIVITY): Troponin I (High Sensitivity): 3 ng/L (ref <18)

## 2020-06-18 LAB — LIPASE, BLOOD: Lipase: 39 U/L (ref 11–51)

## 2020-06-18 LAB — CBG MONITORING, ED: Glucose-Capillary: 145 mg/dL — ABNORMAL HIGH (ref 70–99)

## 2020-06-18 MED ORDER — INSULIN GLARGINE 100 UNIT/ML ~~LOC~~ SOLN
30.0000 [IU] | Freq: Every day | SUBCUTANEOUS | Status: DC
  Administered 2020-06-18 (×2): 30 [IU] via SUBCUTANEOUS
  Filled 2020-06-18 (×4): qty 0.3

## 2020-06-18 MED ORDER — INSULIN ASPART 100 UNIT/ML ~~LOC~~ SOLN: 0.0000 [IU] | Freq: Every day | SUBCUTANEOUS | Status: DC

## 2020-06-18 MED ORDER — ROSUVASTATIN CALCIUM 20 MG PO TABS
40.0000 mg | ORAL_TABLET | Freq: Every day | ORAL | Status: DC
  Administered 2020-06-18 – 2020-06-19 (×2): 40 mg via ORAL
  Filled 2020-06-18 (×2): qty 2

## 2020-06-18 MED ORDER — ACETAMINOPHEN 325 MG PO TABS
650.0000 mg | ORAL_TABLET | ORAL | Status: DC | PRN
  Administered 2020-06-18: 650 mg via ORAL
  Filled 2020-06-18: qty 2

## 2020-06-18 MED ORDER — SODIUM CHLORIDE 0.9 % IV SOLN: INTRAVENOUS | Status: AC

## 2020-06-18 MED ORDER — ACETAMINOPHEN 650 MG RE SUPP: 650.0000 mg | RECTAL | Status: DC | PRN

## 2020-06-18 MED ORDER — STROKE: EARLY STAGES OF RECOVERY BOOK
Freq: Once | Status: AC
  Filled 2020-06-18: qty 1

## 2020-06-18 MED ORDER — ONDANSETRON HCL 4 MG/2ML IJ SOLN
4.0000 mg | Freq: Four times a day (QID) | INTRAMUSCULAR | Status: DC | PRN
  Administered 2020-06-18: 4 mg via INTRAVENOUS
  Filled 2020-06-18: qty 2

## 2020-06-18 MED ORDER — TAMSULOSIN HCL 0.4 MG PO CAPS
0.8000 mg | ORAL_CAPSULE | Freq: Every day | ORAL | Status: DC
  Administered 2020-06-18 – 2020-06-19 (×2): 0.8 mg via ORAL
  Filled 2020-06-18 (×2): qty 2

## 2020-06-18 MED ORDER — LISINOPRIL 5 MG PO TABS
5.0000 mg | ORAL_TABLET | Freq: Every day | ORAL | Status: DC
  Administered 2020-06-18 – 2020-06-19 (×2): 5 mg via ORAL
  Filled 2020-06-18 (×2): qty 1

## 2020-06-18 MED ORDER — ASPIRIN EC 325 MG PO TBEC
325.0000 mg | DELAYED_RELEASE_TABLET | Freq: Once | ORAL | Status: AC
  Administered 2020-06-18: 325 mg via ORAL
  Filled 2020-06-18: qty 1

## 2020-06-18 MED ORDER — SODIUM CHLORIDE 0.9 % IV BOLUS
1000.0000 mL | Freq: Once | INTRAVENOUS | Status: AC
  Administered 2020-06-18: 1000 mL via INTRAVENOUS

## 2020-06-18 MED ORDER — SENNOSIDES-DOCUSATE SODIUM 8.6-50 MG PO TABS: 1.0000 | ORAL_TABLET | Freq: Every evening | ORAL | Status: DC | PRN

## 2020-06-18 MED ORDER — INSULIN ASPART 100 UNIT/ML ~~LOC~~ SOLN
0.0000 [IU] | Freq: Three times a day (TID) | SUBCUTANEOUS | Status: DC
  Administered 2020-06-18: 2 [IU] via SUBCUTANEOUS
  Administered 2020-06-19: 1 [IU] via SUBCUTANEOUS

## 2020-06-18 MED ORDER — ACETAMINOPHEN 160 MG/5ML PO SOLN: 650.0000 mg | ORAL | Status: DC | PRN

## 2020-06-18 MED ORDER — ENOXAPARIN SODIUM 40 MG/0.4ML ~~LOC~~ SOLN
40.0000 mg | SUBCUTANEOUS | Status: DC
  Administered 2020-06-18 – 2020-06-19 (×2): 40 mg via SUBCUTANEOUS
  Filled 2020-06-18 (×2): qty 0.4

## 2020-06-18 MED ORDER — FAMOTIDINE 20 MG PO TABS
40.0000 mg | ORAL_TABLET | Freq: Every day | ORAL | Status: DC
  Administered 2020-06-18 – 2020-06-19 (×2): 40 mg via ORAL
  Filled 2020-06-18 (×2): qty 2

## 2020-06-18 MED ORDER — ASPIRIN EC 81 MG PO TBEC
81.0000 mg | DELAYED_RELEASE_TABLET | Freq: Every day | ORAL | Status: DC
  Administered 2020-06-19: 81 mg via ORAL
  Filled 2020-06-18: qty 1

## 2020-06-18 NOTE — Progress Notes (Signed)
Neurology Progress Note   S:// Patient is lying in bed in NAD. He states he feels better than yesterday and symptoms have resolved. No family at bedside    O:// Current vital signs: BP 117/69 (BP Location: Right Arm)   Pulse 98   Temp 98.7 F (37.1 C) (Oral)   Resp 18   Ht 5\' 8"  (1.727 m)   Wt 91.8 kg   SpO2 96%   BMI 30.77 kg/m  Vital signs in last 24 hours: Temp:  [98.4 F (36.9 C)-98.7 F (37.1 C)] 98.7 F (37.1 C) (04/24 1006) Pulse Rate:  [91-112] 98 (04/24 1006) Resp:  [16-28] 18 (04/24 1006) BP: (96-128)/(64-88) 117/69 (04/24 1006) SpO2:  [93 %-100 %] 96 % (04/24 1006) Weight:  [91.8 kg] 91.8 kg (04/23 2050)   GENERAL: Awake, alert in NAD HEENT: - Normocephalic and atraumatic, dry mm LUNGS - Clear to auscultation bilaterally with no wheezes CV - S1S2 RRR, no m/r/g, equal pulses bilaterally. ABDOMEN - Soft, nontender, nondistended with normoactive BS Ext: warm, well perfused, intact peripheral pulses, no edema Psych: flat affect   NEURO:  Mental Status: AA&Ox3  Language: speech is clear.  Naming, repetition, fluency, and comprehension intact. Cranial Nerves: PERRL 23mm/brisk. EOMI, visual fields full, no facial asymmetry facial sensation intact, hearing intact, tongue/uvula/soft palate midline, normal sternocleidomastoid and trapezius muscle strength. No evidence of tongue atrophy or fibrillations Motor: 4/5 left leg 5/5 left upper arm, right upper, and right leg Tone: is normal and bulk is normal Sensation- Intact to light touch bilaterally Coordination: FTN intact bilaterally, no ataxia in BLE. Gait- deferred   Medications  Current Facility-Administered Medications:  .  acetaminophen (TYLENOL) tablet 650 mg, 650 mg, Oral, Q4H PRN **OR** acetaminophen (TYLENOL) 160 MG/5ML solution 650 mg, 650 mg, Per Tube, Q4H PRN **OR** acetaminophen (TYLENOL) suppository 650 mg, 650 mg, Rectal, Q4H PRN, Opyd, 3m, MD .  Lavone Neri ON 06/19/2020] aspirin EC tablet 81 mg,  81 mg, Oral, Daily, 06/21/2020, MD .  enoxaparin (LOVENOX) injection 40 mg, 40 mg, Subcutaneous, Q24H, Opyd, Muneer S, MD, 40 mg at 06/18/20 1110 .  famotidine (PEPCID) tablet 40 mg, 40 mg, Oral, Daily, Opyd, 06/20/20, MD, 40 mg at 06/18/20 1110 .  insulin aspart (novoLOG) injection 0-5 Units, 0-5 Units, Subcutaneous, QHS, Opyd, Deryk S, MD .  insulin aspart (novoLOG) injection 0-9 Units, 0-9 Units, Subcutaneous, TID WC, Opyd, Zyion S, MD .  insulin glargine (LANTUS) injection 30 Units, 30 Units, Subcutaneous, QHS, Opyd, 06/20/20, MD, 30 Units at 06/18/20 0216 .  lisinopril (ZESTRIL) tablet 5 mg, 5 mg, Oral, Daily, Opyd, 06/20/20, MD, 5 mg at 06/18/20 1111 .  ondansetron (ZOFRAN) injection 4 mg, 4 mg, Intravenous, Q6H PRN, Opyd, 06/20/20, MD .  rosuvastatin (CRESTOR) tablet 40 mg, 40 mg, Oral, Daily, Opyd, Lavone Neri, MD, 40 mg at 06/18/20 1110 .  senna-docusate (Senokot-S) tablet 1 tablet, 1 tablet, Oral, QHS PRN, Opyd, Abrar S, MD .  sodium chloride flush (NS) 0.9 % injection 3 mL, 3 mL, Intravenous, Once, Opyd, Trask S, MD .  tamsulosin (FLOMAX) capsule 0.8 mg, 0.8 mg, Oral, Daily, Opyd, Josten S, MD, 0.8 mg at 06/18/20 1109 Labs CBC    Component Value Date/Time   WBC 15.9 (H) 06/18/2020 0425   RBC 4.86 06/18/2020 0425   HGB 15.0 06/18/2020 0425   HCT 44.7 06/18/2020 0425   PLT 225 06/18/2020 0425   MCV 92.0 06/18/2020 0425   MCH 30.9 06/18/2020 0425   MCHC  33.6 06/18/2020 0425   RDW 12.2 06/18/2020 0425   LYMPHSABS 1.3 06/17/2020 2041   MONOABS 0.9 06/17/2020 2041   EOSABS 0.1 06/17/2020 2041   BASOSABS 0.1 06/17/2020 2041    CMP     Component Value Date/Time   NA 135 06/18/2020 0425   K 3.9 06/18/2020 0425   CL 104 06/18/2020 0425   CO2 22 06/18/2020 0425   GLUCOSE 196 (H) 06/18/2020 0425   BUN 15 06/18/2020 0425   CREATININE 0.92 06/18/2020 0425   CALCIUM 8.6 (L) 06/18/2020 0425   PROT 6.5 06/18/2020 0425   ALBUMIN 3.7 06/18/2020 0425   AST 18  06/18/2020 0425   ALT 17 06/18/2020 0425   ALKPHOS 38 06/18/2020 0425   BILITOT 2.1 (H) 06/18/2020 0425   GFRNONAA >60 06/18/2020 0425    glycosylated hemoglobin  Lipid Panel     Component Value Date/Time   CHOL 93 06/18/2020 0425   TRIG 62 06/18/2020 0425   HDL 34 (L) 06/18/2020 0425   CHOLHDL 2.7 06/18/2020 0425   VLDL 12 06/18/2020 0425   LDLCALC 47 06/18/2020 0425     Imaging I have reviewed images in epic and the results pertinent to this consultation are:  CT-scan of the brain Normal   CTA H/N normal   MRI examination of the brain No acute process  Assessment:  Giorgi Debruin is an 55 y.o. male with a PMHx of DM and HTN, presenting acutely from [facility] via EMS after having a near-syncopal episode in conjunction with diaphoresis and upper extremity tremor prior to eating. He also started to experience nausea, SOB and H/A.   Transient Left facial droop, resolved  -CT, CTA H/N and MRI H normal  EKG NSR - Echo pending  - bASA/lovenox  - PT/OT   HTN  Allow for permssiove HTN for 24 hrs  Continue home lisinopril  HLD  LDL-c 47. Continue home crestor  Near syncope episode  Check orthostatic vitals   Type 2 DM with insulin use  HGBA1C 9.3 SSI   Neurology will sign off  Gevena Mart, DNP, ACNPC-AG

## 2020-06-18 NOTE — Evaluation (Signed)
Occupational Therapy Evaluation Patient Details Name: Eric Bauer MRN: [id number] DOB: [birth year] Today's Date: 06/18/2020    History of Present Illness Pt is a 55 yo male s/p generalized weakness, chest pain, L facial droop, flat affect and nausea. Pt MRI/CT head have been negative for acute abnomality. Chest x-ray reveals low volumes with bibasilar atelectasis.  Pt PMHx:HTN, IDDM.   Clinical Impression   Pt PTA: Pt independent with ADL and mobility. Pt currently, answering PLOF questions, but often needed directions repeated or would disregard next direction. Pt unable to fully describe how he was feeling. Pt limited by nausea, decreased strength bilaterally and decreased activity tolerance. Pt minguardA for ADL and minguardA for ADL functional mobility; requiring RW for stability and nausea with all functional tasks. RN made aware. ?cognitive deficits-unsure if pt was distracted by his level of discomfort from nausea vs cognitive deficit.  Pt dizziness in standing, BP not orthostatic. BP in sitting: 104/71 and standing 109/73. HR 90-105 BPM Pt would benefit from continued OT skilled services. OT following acutely.    Follow Up Recommendations  Home health OT;Supervision/Assistance - 24 hour (initially would benefit from 24/7)    Equipment Recommendations  None recommended by OT    Recommendations for Other Services       Precautions / Restrictions Precautions Precautions: Fall Restrictions Weight Bearing Restrictions: No      Mobility Bed Mobility Overal bed mobility: Needs Assistance Bed Mobility: Supine to Sit;Rolling;Sit to Supine Rolling: Supervision   Supine to sit: Min guard Sit to supine: Min guard   General bed mobility comments: cues to manuever legs to EOB and scoot to EOB;    Transfers Overall transfer level: Needs assistance Equipment used: Rolling walker (2 wheeled) Transfers: Sit to/from [employer] Sit to Stand: Min guard Stand  pivot transfers: Min guard       General transfer comment: MinguardA for safety ans stability in standing as pt was dizzy, not orthostatic.    Balance Overall balance assessment: Needs assistance Sitting-balance support: Bilateral upper extremity supported;Feet supported Sitting balance-Leahy Scale: Fair     Standing balance support: Bilateral upper extremity supported Standing balance-Leahy Scale: Poor Standing balance comment: performing grooming tasks; requiring hip to rest against counter                           ADL either performed or assessed with clinical judgement   ADL Overall ADL's : Needs assistance/impaired Eating/Feeding: Set up;Sitting   Grooming: Min guard;Standing Grooming Details (indicate cue type and reason): standing at sink x4 mins for grooming Upper Body Bathing: Min guard;Standing   Lower Body Bathing: Min guard;Sitting/lateral leans;Sit to/from stand   Upper Body Dressing : Min guard;Standing   Lower Body Dressing: Min guard;Cueing for safety;Sitting/lateral leans;Sit to/from stand Lower Body Dressing Details (indicate cue type and reason): donning socks at EOB Toilet Transfer: Min IT sales professional Details (indicate cue type and reason): slow, cautious steps Toileting- Clothing Manipulation and Hygiene: Supervision/safety       Functional mobility during ADLs: Min guard;Rolling walker;Cueing for safety General ADL Comments: Pt limited by nausea, decreased strength bilaterally and decreased activity tolerance. Pt dizziness in standing, BP not orthostatic. BP in sitting: 104/71 and standing 109/73.     Vision Baseline Vision/History: Wears glasses Wears Glasses: At all times Patient Visual Report: No change from baseline Vision Assessment?: No apparent visual deficits;Yes Eye Alignment: Within Functional Limits Ocular Range of Motion: Within Functional Limits Alignment/Gaze Preference: Within Defined  Limits Tracking/Visual Pursuits: Able to track stimulus in all quads without difficulty Additional Comments: reports blurriness, but pt WFLs for all vision screening.     Perception     Praxis      Pertinent Vitals/Pain Pain Assessment: 0-10 Pain Score: 5  Pain Location: stomach Pain Descriptors / Indicators: Discomfort Pain Intervention(s): Monitored during session;Repositioned;Other (comment) (meds for nausea)     Hand Dominance Right   Extremity/Trunk Assessment Upper Extremity Assessment Upper Extremity Assessment: Generalized weakness;RUE deficits/detail;LUE deficits/detail RUE Deficits / Details: slight tremors, slight incoordination RUE Coordination: decreased fine motor;decreased gross motor LUE Deficits / Details: slight tremors, slight incoordination LUE Coordination: decreased fine motor;decreased gross motor   Lower Extremity Assessment Lower Extremity Assessment: Generalized weakness   Cervical / Trunk Assessment Cervical / Trunk Assessment: Normal   Communication Communication Communication: No difficulties   Cognition Arousal/Alertness: Awake/alert Behavior During Therapy: Flat affect Overall Cognitive Status: Impaired/Different from baseline Area of Impairment: Following commands;Safety/judgement                       Following Commands: Follows one step commands with increased time;Follows one step commands inconsistently Safety/Judgement: Decreased awareness of safety;Decreased awareness of deficits     General Comments: Pt answered PLOF questions, but often needed directions repeated or would disregard next direction. Pt unable to fully describe how he was feeling. Pt having trouble with stating months in reverse order started in April and went to May; then started in Dec and went to Jan with accuracy; pt spelling 'world' backwards and counting by 5s down from 100.   General Comments  O2 >90% on RA; HR 90-105 BPM. Pt stood at EOB and reported  dizziness; BP stable.    Exercises     Shoulder Instructions      Home Living Family/patient expects to be discharged to:: Private residence Living Arrangements: Alone Available Help at Discharge: Family;Available PRN/intermittently Type of Home: House Home Access: Stairs to enter [employer] of Steps: 4   Home Layout: One level     Bathroom Shower/Tub: Chief Strategy Officer: Standard     Home Equipment: None          Prior Functioning/Environment Level of Independence: Independent        Comments: independent with ADL/iADL, works as a Museum/gallery exhibitions officer.        OT Problem List: Decreased strength;Decreased activity tolerance;Impaired balance (sitting and/or standing);Decreased coordination;Decreased cognition;Decreased safety awareness;Pain;Impaired vision/perception      OT Treatment/Interventions: Self-care/ADL training;Therapeutic exercise;Energy conservation;DME and/or AE instruction;Therapeutic activities;Cognitive remediation/compensation;Visual/perceptual remediation/compensation;Patient/family education;Balance training;Neuromuscular education    OT Goals(Current goals can be found in the care plan section) Acute Rehab OT Goals Patient Stated Goal: to go home OT Goal Formulation: With patient Time For Goal Achievement: 07/02/20 Potential to Achieve Goals: Good ADL Goals Pt Will Perform Grooming: with supervision;standing Pt/caregiver will Perform Home Exercise Program: Increased strength;Both right and left upper extremity;With Supervision Additional ADL Goal #1: Pt will increase to supervisionA for OOB ADL functional tasks x6 mins with 1 seated rest break. Additional ADL Goal #2: Pt will perform higher level cognitive tasks with 90% accuracy and minimal cues.  OT Frequency: Min 2X/week   Barriers to D/C:            Co-evaluation              AM-PAC OT "6 Clicks" Daily Activity     Outcome Measure Help from another  person eating meals?: A Little Help from  another person taking care of personal grooming?: A Little Help from another person toileting, which includes using toliet, bedpan, or urinal?: A Little Help from another person bathing (including washing, rinsing, drying)?: A Little Help from another person to put on and taking off regular upper body clothing?: A Little Help from another person to put on and taking off regular lower body clothing?: A Little 6 Click Score: 18   End of Session Equipment Utilized During Treatment: Gait belt;Rolling walker Nurse Communication: Mobility status  Activity Tolerance: Patient tolerated treatment well Patient left: in bed;with call bell/phone within reach;with bed alarm set;with family/visitor present  OT Visit Diagnosis: Unsteadiness on feet (R26.81);Muscle weakness (generalized) (M62.81);Other symptoms and signs involving cognitive function                Time: 2956-2130 OT Time Calculation (min): 39 min Charges:  OT General Charges $OT Visit: 1 Visit OT Evaluation $OT Eval Moderate Complexity: 1 Mod OT Treatments $Self Care/Home Management : 8-22 mins $Cognitive Funtion inital: Initial 15 mins  Flora Lipps, OTR/L Acute Rehabilitation Services Pager: [phone] Office: [phone]   Levia Waltermire C 06/18/2020, 2:09 PM

## 2020-06-18 NOTE — Evaluation (Signed)
Physical Therapy Evaluation Patient Details Name: Eric Bauer MRN: [id number] DOB: 06-21-1965 Today's Date: 06/18/2020   History of Present Illness  Pt is a 55 yo male s/p generalized weakness, chest pain, L facial droop, flat affect and nausea. Pt MRI/CT head have been negative for acute abnomality. Chest x-ray reveals low volumes with bibasilar atelectasis.  Pt PMHx:HTN, IDDM.  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 Pt with difficulty describing symptoms, but reports continued nausea and weakness. Ambulating x 150 feet with no assistive device at a supervision level. SpO2 96% on RA, HR stable. Pt demonstrates decreased stride length, gait speed, and reciprocal arm swing. He also displays decreased cardiopulmonary endurance and balance deficits. Will continue to follow acutely to progress as tolerated.     Follow Up Recommendations Outpatient PT;Supervision for mobility/OOB    Equipment Recommendations  None recommended by PT    Recommendations for Other Services       Precautions / Restrictions Precautions Precautions: Fall Restrictions Weight Bearing Restrictions: No      Mobility  Bed Mobility Overal bed mobility: Modified Independent Bed Mobility: Supine to Sit;Rolling;Sit to Supine Rolling: Supervision   Supine to sit: Min guard Sit to supine: Min guard   General bed mobility comments: cues to manuever legs to EOB and scoot to EOB;    Transfers Overall transfer level: Needs assistance Equipment used: None Transfers: Sit to/from Stand Sit to Stand: Supervision Stand pivot transfers: Min guard       General transfer comment: MinguardA for safety ans stability in standing as pt was dizzy, not orthostatic.  Ambulation/Gait Ambulation/Gait assistance: Supervision Gait Distance (Feet): 150 Feet Assistive device: None Gait Pattern/deviations: Step-through pattern;Decreased stride length;Decreased dorsiflexion -  right;Decreased dorsiflexion - left     General Gait Details: Slow speed with decreased bilateral foot clearance and step length. No overt LOB  Stairs            Wheelchair Mobility    Modified Rankin (Stroke Patients Only) Modified Rankin (Stroke Patients Only) Pre-Morbid Rankin Score: No symptoms Modified Rankin: Moderately severe disability     Balance Overall balance assessment: Needs assistance Sitting-balance support: Feet supported Sitting balance-Leahy Scale: Good     Standing balance support: No upper extremity supported;During functional activity Standing balance-Leahy Scale: Good Standing balance comment: performing grooming tasks; requiring hip to rest against counter                             Pertinent Vitals/Pain Pain Assessment: Faces Pain Score: 5  Faces Pain Scale: Hurts a little bit Pain Location: stomach Pain Descriptors / Indicators: Discomfort Pain Intervention(s): Monitored during session    Home Living Family/patient expects to be discharged to:: Private residence Living Arrangements: Alone Available Help at Discharge: Family;Available PRN/intermittently Type of Home: House Home Access: Stairs to enter   [employer] of Steps: 4 Home Layout: One level Home Equipment: None      Prior Function Level of Independence: Independent         Comments: works as a Museum/gallery exhibitions officer.     Hand Dominance   Dominant Hand: Right    Extremity/Trunk Assessment   Upper Extremity Assessment Upper Extremity Assessment: RUE deficits/detail;LUE deficits/detail RUE Deficits / Details: 3/5 strength, tremors, slight incoordination RUE Coordination: decreased fine motor;decreased gross motor LUE Deficits / Details: 3/5 strength, tremors, slight incoordination LUE Coordination: decreased fine motor;decreased gross motor    Lower Extremity  Assessment Lower Extremity Assessment: RLE deficits/detail;LLE deficits/detail RLE  Deficits / Details: Grossly 4+/5 LLE Deficits / Details: Grossly 4+/5    Cervical / Trunk Assessment Cervical / Trunk Assessment: Normal  Communication   Communication: No difficulties  Cognition Arousal/Alertness: Awake/alert Behavior During Therapy: Flat affect Overall Cognitive Status: Impaired/Different from baseline Area of Impairment: Safety/judgement                       Following Commands: Follows one step commands with increased time;Follows one step commands inconsistently Safety/Judgement: Decreased awareness of deficits     General Comments: Pt with difficulty describing symptoms      General Comments General comments (skin integrity, edema, etc.): O2 >90% on RA; HR 90-105 BPM. Pt stood at EOB and reported dizziness; BP stable.    Exercises     Assessment/Plan    PT Assessment Patient needs continued PT services  PT Problem List Decreased strength;Decreased activity tolerance;Decreased mobility;Decreased balance       PT Treatment Interventions DME instruction;Stair training;Gait training;Functional mobility training;Therapeutic exercise;Therapeutic activities;Balance training;Patient/family education    PT Goals (Current goals can be found in the Care Plan section)  Acute Rehab PT Goals Patient Stated Goal: to go home PT Goal Formulation: With patient Time For Goal Achievement: 07/02/20 Potential to Achieve Goals: Good    Frequency Min 3X/week   Barriers to discharge        Co-evaluation               AM-PAC PT "6 Clicks" Mobility  Outcome Measure Help needed turning from your back to your side while in a flat bed without using bedrails?: None Help needed moving from lying on your back to sitting on the side of a flat bed without using bedrails?: None Help needed moving to and from a bed to a chair (including a wheelchair)?: A Little Help needed standing up from a chair using your arms (e.g., wheelchair or bedside chair)?: A  Little Help needed to walk in hospital room?: A Little Help needed climbing 3-5 steps with a railing? : A Little 6 Click Score: 20    End of Session   Activity Tolerance: Patient tolerated treatment well Patient left: in bed;with call bell/phone within reach;with bed alarm set Nurse Communication: Mobility status PT Visit Diagnosis: Unsteadiness on feet (R26.81);Other abnormalities of gait and mobility (R26.89);Difficulty in walking, not elsewhere classified (R26.2)    Time: 7680-8811 PT Time Calculation (min) (ACUTE ONLY): 16 min   Charges:   PT Evaluation $PT Eval Moderate Complexity: 1 Mod          Lillia Pauls, PT, DPT Acute Rehabilitation Services Pager [phone] Office [phone]   Norval Morton 06/18/2020, 4:00 PM

## 2020-06-18 NOTE — Progress Notes (Signed)
Called ED for report, RN not available. Gave extension to return call.

## 2020-06-18 NOTE — Progress Notes (Signed)
  Echocardiogram 2D Echocardiogram has been performed.  Delcie Roch 06/18/2020, 9:50 AM

## 2020-06-18 NOTE — H&P (Addendum)
History and Physical    Eric Bauer ZOX:[id number] DOB: 05/09/1965 DOA: 06/17/2020  PCP: Pcp, No   Patient coming from: Home   Chief Complaint: Left facial droop, lightheadedness, fatigue, chest pain, nausea, headache   HPI: Eric Bauer is a 55 y.o. male with medical history significant for insulin-dependent diabetes mellitus and hyperlipidemia, now presenting to the emergency department with cute onset of fatigue, lightheadedness, nausea, chest pain, and headache, and was also noted to have left facial droop.  Patient was reportedly in his usual state of health, had an uneventful day, and was then seated at a restaurant when he developed acute onset of the aforementioned symptoms.  Patient felt as though he was going to pass out but did not appear to actually lose consciousness.  Symptoms have subsided by time of arrival in the ED but he continues to have some nausea, fatigue, lightheadedness, and pleuritic chest pain.  Denies any recent fevers, cough, diarrhea, dysuria, or flank pain.  Denies neck stiffness.  He was given IV fluids by EMS prior to arrival in the ED.  ED Course: Upon arrival to the ED, patient is found to be afebrile, saturating well on room air, tachycardic in the 110s, and with stable blood pressure.  EKG features sinus rhythm with ST elevation in V2 and V3, probable early repolarization pattern.  Chest x-ray reveals low volumes with bibasilar atelectasis.  Head CT was normal.  CTA head and neck appeared normal.  Chemistry panel notable for bilirubin of 2.1.  CBC features a leukocytosis to 16,300.  INR, high-sensitivity troponin, and D-dimer were normal.  Patient was given a liter of saline and Phenergan in the ED.  Neurology was consulted by the ED physician and hospitalists asked to admit.  Review of Systems:  All other systems reviewed and apart from HPI, are negative.  History reviewed. No pertinent past medical history.  History reviewed. No pertinent surgical  history.  Social History:   reports that he has never smoked. He has never used smokeless tobacco. He reports previous alcohol use. No history on file for drug use.  No Known Allergies  Family History  Problem Relation Age of Onset  . Liver cancer Mother   . Heart attack Father      Prior to Admission medications   Medication Sig Start Date End Date Taking? Authorizing Provider  famotidine (PEPCID) 40 MG tablet Take 40 mg by mouth daily. 06/09/20  Yes [provider]  JANUMET 50-1000 MG tablet Take 1 tablet by mouth 2 (two) times daily with a meal. 05/26/20  Yes [provider]  lisinopril (ZESTRIL) 5 MG tablet Take 5 mg by mouth daily. 06/09/20  Yes [provider]  rosuvastatin (CRESTOR) 40 MG tablet Take 40 mg by mouth daily. 06/09/20  Yes [provider]  tamsulosin (FLOMAX) 0.4 MG CAPS capsule Take 0.8 mg by mouth daily. 06/09/20  Yes [provider]  TRESIBA FLEXTOUCH 100 UNIT/ML FlexTouch Pen Inject 46 Units into the skin at bedtime. 06/09/20  Yes [provider]    Physical Exam: Vitals:   06/17/20 2200 06/17/20 2215 06/17/20 2300 06/17/20 2315  BP: 125/84 120/83 124/86 127/88  Pulse: 97 (!) 110 (!) 109 (!) 112  Resp: (!) 26 20 (!) 25 18  Temp:      TempSrc:      SpO2: 99% 100% 99% 97%  Weight:      Height:        Constitutional: NAD, calm  Eyes: PERTLA, lids and conjunctivae  normal ENMT: Mucous membranes are moist. Posterior pharynx clear of any exudate or lesions.   Neck: supple, no masses  Respiratory:   no wheezing, no crackles. No accessory muscle use.  Cardiovascular: S1 & S2 heard, regular rate and rhythm. No extremity edema.  Abdomen: No distension, no tenderness, soft. Bowel sounds active.  Musculoskeletal: no clubbing / cyanosis. No joint deformity upper and lower extremities.   Skin: no significant rashes, lesions, ulcers. Warm, dry, well-perfused. Neurologic: CN 2-12 grossly intact. Sensation intact. Poor  effort with strength testing, 5/5 in all 4 limbs with encouragement.  Psychiatric: Alert and oriented to person, place, and situation. Calm and cooperative.    Labs and Imaging on Admission: I have personally reviewed following labs and imaging studies  CBC: Recent Labs  Lab 06/17/20 2041 06/17/20 2120  WBC 16.3*  --   NEUTROABS 13.9*  --   HGB 15.4 15.3  HCT 46.9 45.0  MCV 93.1  --   PLT 244  --    Basic Metabolic Panel: Recent Labs  Lab 06/17/20 2041 06/17/20 2120  NA 135 138  K 3.9 3.9  CL 101 101  CO2 24  --   GLUCOSE 183* 178*  BUN 16 19  CREATININE 0.95 0.90  CALCIUM 9.1  --    GFR: Estimated Creatinine Clearance: 104.5 mL/min (by C-G formula based on SCr of 0.9 mg/dL). Liver Function Tests: Recent Labs  Lab 06/17/20 2041  AST 19  ALT 19  ALKPHOS 42  BILITOT 2.1*  PROT 7.0  ALBUMIN 4.2   No results for input(s): LIPASE, AMYLASE in the last 168 hours. No results for input(s): AMMONIA in the last 168 hours. Coagulation Profile: Recent Labs  Lab 06/17/20 2041  INR 1.0   Cardiac Enzymes: No results for input(s): CKTOTAL, CKMB, CKMBINDEX, TROPONINI in the last 168 hours. BNP (last 3 results) No results for input(s): PROBNP in the last 8760 hours. HbA1C: No results for input(s): HGBA1C in the last 72 hours. CBG: Recent Labs  Lab 06/17/20 2042 06/18/20 0155  GLUCAP 168* 145*   Lipid Profile: No results for input(s): CHOL, HDL, LDLCALC, TRIG, CHOLHDL, LDLDIRECT in the last 72 hours. Thyroid Function Tests: No results for input(s): TSH, T4TOTAL, FREET4, T3FREE, THYROIDAB in the last 72 hours. Anemia Panel: No results for input(s): VITAMINB12, FOLATE, FERRITIN, TIBC, IRON, RETICCTPCT in the last 72 hours. Urine analysis:    Component Value Date/Time   COLORURINE YELLOW 06/18/2020 0039   APPEARANCEUR HAZY (A) 06/18/2020 0039   LABSPEC 1.034 (H) 06/18/2020 0039   PHURINE 6.0 06/18/2020 0039   GLUCOSEU 50 (A) 06/18/2020 0039   HGBUR NEGATIVE  06/18/2020 0039   BILIRUBINUR NEGATIVE 06/18/2020 0039   KETONESUR 5 (A) 06/18/2020 0039   PROTEINUR NEGATIVE 06/18/2020 0039   NITRITE NEGATIVE 06/18/2020 0039   LEUKOCYTESUR NEGATIVE 06/18/2020 0039   Sepsis Labs: @LABRCNTIP (procalcitonin:4,lacticidven:4) )No results found for this or any previous visit (from the past 240 hour(s)).   Radiological Exams on Admission: CT Angio Head W or Wo Contrast  Result Date: 06/17/2020 CLINICAL DATA:  Stroke/TIA, assess intracranial arteries; Stroke/TIA, assess intracranial arteries Stroke/TIA, assess extracranial arteries EXAM: CT ANGIOGRAPHY HEAD AND NECK TECHNIQUE: Multidetector CT imaging of the head and neck was performed using the standard protocol during bolus administration of intravenous contrast. Multiplanar CT image reconstructions and MIPs were obtained to evaluate the vascular anatomy. Carotid stenosis measurements (when applicable) are obtained utilizing NASCET criteria, using the distal internal carotid diameter as the denominator. CONTRAST:  32mL OMNIPAQUE IOHEXOL  350 MG/ML SOLN COMPARISON:  None. FINDINGS: CTA NECK FINDINGS SKELETON: There is no bony spinal canal stenosis. No lytic or blastic lesion. OTHER NECK: Normal pharynx, larynx and major salivary glands. No cervical lymphadenopathy. Unremarkable thyroid gland. UPPER CHEST: No pneumothorax or pleural effusion. No nodules or masses. AORTIC ARCH: There is no calcific atherosclerosis of the aortic arch. There is no aneurysm, dissection or hemodynamically significant stenosis of the visualized portion of the aorta. Conventional 3 vessel aortic branching pattern. The visualized proximal subclavian arteries are widely patent. RIGHT CAROTID SYSTEM: Normal without aneurysm, dissection or stenosis. LEFT CAROTID SYSTEM: Normal without aneurysm, dissection or stenosis. VERTEBRAL ARTERIES: Left dominant configuration. Both origins are clearly patent. There is no dissection, occlusion or flow-limiting  stenosis to the skull base (V1-V3 segments). CTA HEAD FINDINGS POSTERIOR CIRCULATION: --Vertebral arteries: Normal V4 segments. --Inferior cerebellar arteries: Normal. --Basilar artery: Normal. --Superior cerebellar arteries: Normal. --Posterior cerebral arteries (PCA): Normal. The right PCA is partially supplied by a posterior communicating artery (p-comm). ANTERIOR CIRCULATION: --Intracranial internal carotid arteries: Normal. --Anterior cerebral arteries (ACA): Normal. Both A1 segments are present. Patent anterior communicating artery (a-comm). --Middle cerebral arteries (MCA): Normal. VENOUS SINUSES: As permitted by contrast timing, patent. ANATOMIC VARIANTS: None Review of the MIP images confirms the above findings. IMPRESSION: Normal CTA of the head and neck. Electronically Signed   By: Deatra Robinson M.D.   On: 06/17/2020 23:55   CT Angio Neck W and/or Wo Contrast  Result Date: 06/17/2020 CLINICAL DATA:  Stroke/TIA, assess intracranial arteries; Stroke/TIA, assess intracranial arteries Stroke/TIA, assess extracranial arteries EXAM: CT ANGIOGRAPHY HEAD AND NECK TECHNIQUE: Multidetector CT imaging of the head and neck was performed using the standard protocol during bolus administration of intravenous contrast. Multiplanar CT image reconstructions and MIPs were obtained to evaluate the vascular anatomy. Carotid stenosis measurements (when applicable) are obtained utilizing NASCET criteria, using the distal internal carotid diameter as the denominator. CONTRAST:  106mL OMNIPAQUE IOHEXOL 350 MG/ML SOLN COMPARISON:  None. FINDINGS: CTA NECK FINDINGS SKELETON: There is no bony spinal canal stenosis. No lytic or blastic lesion. OTHER NECK: Normal pharynx, larynx and major salivary glands. No cervical lymphadenopathy. Unremarkable thyroid gland. UPPER CHEST: No pneumothorax or pleural effusion. No nodules or masses. AORTIC ARCH: There is no calcific atherosclerosis of the aortic arch. There is no aneurysm,  dissection or hemodynamically significant stenosis of the visualized portion of the aorta. Conventional 3 vessel aortic branching pattern. The visualized proximal subclavian arteries are widely patent. RIGHT CAROTID SYSTEM: Normal without aneurysm, dissection or stenosis. LEFT CAROTID SYSTEM: Normal without aneurysm, dissection or stenosis. VERTEBRAL ARTERIES: Left dominant configuration. Both origins are clearly patent. There is no dissection, occlusion or flow-limiting stenosis to the skull base (V1-V3 segments). CTA HEAD FINDINGS POSTERIOR CIRCULATION: --Vertebral arteries: Normal V4 segments. --Inferior cerebellar arteries: Normal. --Basilar artery: Normal. --Superior cerebellar arteries: Normal. --Posterior cerebral arteries (PCA): Normal. The right PCA is partially supplied by a posterior communicating artery (p-comm). ANTERIOR CIRCULATION: --Intracranial internal carotid arteries: Normal. --Anterior cerebral arteries (ACA): Normal. Both A1 segments are present. Patent anterior communicating artery (a-comm). --Middle cerebral arteries (MCA): Normal. VENOUS SINUSES: As permitted by contrast timing, patent. ANATOMIC VARIANTS: None Review of the MIP images confirms the above findings. IMPRESSION: Normal CTA of the head and neck. Electronically Signed   By: Deatra Robinson M.D.   On: 06/17/2020 23:55   DG Chest Port 1 View  Result Date: 06/17/2020 CLINICAL DATA:  Shortness of breath EXAM: PORTABLE CHEST 1 VIEW COMPARISON:  None. FINDINGS: The heart  size and mediastinal contours are within normal limits. Low lung volumes with bibasilar atelectasis. No focal consolidation. No visible pleural effusion or pneumothorax. The visualized skeletal structures are unremarkable. IMPRESSION: Low lung volumes with bibasilar atelectasis. No focal consolidation. Electronically Signed   By: Maudry Mayhew MD   On: 06/17/2020 21:44   CT HEAD CODE STROKE WO CONTRAST  Result Date: 06/17/2020 CLINICAL DATA:  Code stroke. EXAM:  CT HEAD WITHOUT CONTRAST TECHNIQUE: Contiguous axial images were obtained from the base of the skull through the vertex without intravenous contrast. COMPARISON:  Head CT 07/18/2019 FINDINGS: Brain: There is no mass, hemorrhage or extra-axial collection. The size and configuration of the ventricles and extra-axial CSF spaces are normal. The brain parenchyma is normal, without evidence of acute or chronic infarction. Vascular: No abnormal hyperdensity of the major intracranial arteries or dural venous sinuses. No intracranial atherosclerosis. Skull: The visualized skull base, calvarium and extracranial soft tissues are normal. Sinuses/Orbits: No fluid levels or advanced mucosal thickening of the visualized paranasal sinuses. No mastoid or middle ear effusion. The orbits are normal. ASPECTS [facility] Stroke Program Early CT Score) - Ganglionic level infarction (caudate, lentiform nuclei, internal capsule, insula, M1-M3 cortex): 7 - Supraganglionic infarction (M4-M6 cortex): 3 Total score (0-10 with 10 being normal): 10 IMPRESSION: 1. Normal head CT. 2. ASPECTS is 10. These results were communicated to Dr. Caryl Pina at 8:59 pm on 06/17/2020 by text page via the [facility] messaging system. Electronically Signed   By: Deatra Robinson M.D.   On: 06/17/2020 21:00    EKG: Independently reviewed. Sinus rhythm with early repolarization pattern.   Assessment/Plan   1. Transient left facial droop  - Presents with lightheadedness, near-syncope, nausea, chest pain, headache, and transient left facial droop   - Head CT and CTA head & neck are normal  - Appreciate neurology consultants  - Continue cardiac monitoring and neuro checks, check MRI brain, A1c, lipid panel, and echocardiogram   2. Near syncope  - Presents with lightheadedness, near-syncope, nausea, chest pain, headache, and transient left facial droop  - Check orthostatic vitals, continue cardiac monitoring, check echocardiogram    3. Chest pain  - Patient  reports pleuritic chest pain in ED, also having pain with palpation  - HS troponin and d-dimer are normal, no acute findings on CXR, EKG with likely early repolarization pattern  - Continue cardiac monitoring, follow-up second troponin and echo    4. Insulin-dependent DM  - A1c was 11.6% in March 2022  - Continue CBG checks and long-acting insulin, add correctional   5. Hyperbilirubinemia  - Total bilirubin is 2.1, increased from priors  - No RUQ pain  - Fractionate, trend    6. Hyperlipidemia  - Continue Crestor   7. SIRS  - Leukocytosis and tachycardia present on admission without any apparent infectious process  - Monitor, culture if febrile    DVT prophylaxis: Lovenox  Code Status: Full  Level of Care: Level of care: Telemetry Medical Family Communication: Son updated at bedside  Disposition Plan:  Patient is from: home  Anticipated d/c is to: Home  Anticipated d/c date is: 06/19/20 Patient currently: Pending MRI, echo   Consults called: Neurology  Admission status: Observation     Briscoe Deutscher, MD [facility]  06/18/2020, 2:29 AM

## 2020-06-18 NOTE — Progress Notes (Signed)
Eric Bauer an 55 y.o.malewith a PMHx of DM and HTN, presenting acutely from [facility] [employer] [facility] via EMS after having a near-syncopal episode in conjunction with diaphoresis and upper extremity tremor prior to eating. He also started to experience nausea, SOB and H/A.   Stroke is ruled out.  Pt seen and examined at bedside.  EEG ordered for further evaluation.    Kathlen Mody, MD

## 2020-06-18 NOTE — ED Notes (Signed)
Pt transported to MRI 

## 2020-06-19 ENCOUNTER — Observation Stay (HOSPITAL_COMMUNITY): Payer: PRIVATE HEALTH INSURANCE

## 2020-06-19 DIAGNOSIS — R29818 Other symptoms and signs involving the nervous system: Secondary | ICD-10-CM | POA: Diagnosis not present

## 2020-06-19 DIAGNOSIS — I6330 Cerebral infarction due to thrombosis of unspecified cerebral artery: Secondary | ICD-10-CM | POA: Diagnosis not present

## 2020-06-19 LAB — GLUCOSE, CAPILLARY
Glucose-Capillary: 108 mg/dL — ABNORMAL HIGH (ref 70–99)
Glucose-Capillary: 136 mg/dL — ABNORMAL HIGH (ref 70–99)

## 2020-06-19 MED ORDER — ASPIRIN 81 MG PO TBEC: 81.0000 mg | DELAYED_RELEASE_TABLET | Freq: Every day | ORAL | 11 refills | Status: AC

## 2020-06-19 NOTE — Procedures (Signed)
Patient Name: Eric Bauer  MRN: [id number]  Epilepsy Attending: Charlsie Quest  Referring Physician/Provider: Dr Beryle Beams Date: 06/19/2020 Duration: 22.43 mins  Patient history: Abdallah Hudspethis an 55 y.o.malewho presented after having a near-syncopal episode in conjunction with diaphoresis and upper extremity tremor prior to eating. EEG to evaluate for seizure  Level of alertness: Awake, drowsy  AEDs during EEG study: None  Technical aspects: This EEG study was done with scalp electrodes positioned according to the 10-20 International system of electrode placement. Electrical activity was acquired at a sampling rate of 500Hz  and reviewed with a high frequency filter of 70Hz  and a low frequency filter of 1Hz . EEG data were recorded continuously and digitally stored.   Description: The posterior dominant rhythm consists of 9 Hz activity of moderate voltage (25-35 uV) seen predominantly in posterior head regions, symmetric and reactive to eye opening and eye closing. Drowsiness was characterized by attenuation of the posterior background rhythm. Physiologic photic driving was not seen during photic stimulation. Hyperventilation was not performed.     IMPRESSION: This study is within normal limits. No seizures or epileptiform discharges were seen throughout the recording.  [name] 

## 2020-06-19 NOTE — Plan of Care (Signed)
  Problem: Education: Goal: Knowledge of General Education information will improve Description: Including pain rating scale, medication(s)/side effects and non-pharmacologic comfort measures Outcome: Progressing   Problem: Clinical Measurements: Goal: Respiratory complications will improve Outcome: Progressing   Problem: Clinical Measurements: Goal: Cardiovascular complication will be avoided Outcome: Progressing   Problem: Pain Managment: Goal: General experience of comfort will improve Outcome: Progressing   

## 2020-06-19 NOTE — Progress Notes (Signed)
SLP Cancellation Note  Patient Details Name: Eric Bauer MRN: [id number] DOB: [birth year]   Cancelled treatment:       Reason Eval/Treat Not Completed: SLP screened, no needs identified, will sign off   Kenniya Westrich, Riley Nearing 06/19/2020, 1:47 PM

## 2020-06-19 NOTE — Progress Notes (Signed)
Routine EEG complete. Results pending.

## 2020-06-19 NOTE — TOC Initial Note (Signed)
Transition of Care [facility]) - Initial/Assessment Note    Patient Details  Name: Eric Bauer MRN: [id number] Date of Birth: [birth year]  Transition of Care [facility]) CM/SW Contact:    Joanne Chars, LCSW Phone Number: 06/19/2020, 2:26 PM  Clinical Narrative: CSW met with pt regarding recommendation for outpt PT.  Pt is from [employer], agreeable to [facility].  Pt lives alone but said he has good family support, son will be able to stay with him initially and daughter can take to appts.  Son will be picking him up from hospital today.              Expected Discharge Plan: Home/Self Care Barriers to Discharge: No Barriers Identified   Patient Goals and CMS Choice Patient states their goals for this hospitalization and ongoing recovery are:: "being independant" CMS Medicare.gov Compare Post Acute Care list provided to::  (na)    Expected Discharge Plan and Services Expected Discharge Plan: Home/Self Care     Post Acute Care Choice:  (outpt PT) Living arrangements for the past 2 months: Single Family Home Expected Discharge Date: 06/19/20               DME Arranged: N/A         HH Arranged: NA          Prior Living Arrangements/Services Living arrangements for the past 2 months: Single Family Home Lives with:: Self Patient language and need for interpreter reviewed:: Yes        Need for Family Participation in Patient Care: Yes (Comment) Care giver support system in place?: Yes (comment) Current home services: Other (comment) (none) Criminal Activity/Legal Involvement Pertinent to Current Situation/Hospitalization: No - Comment as needed  Activities of Daily Living Home Assistive Devices/Equipment: None ADL Screening (condition at time of admission) Patient's cognitive ability adequate to safely complete daily activities?: Yes Is the patient deaf or have difficulty hearing?: No Does the patient have difficulty seeing, even when wearing  glasses/contacts?: No Does the patient have difficulty concentrating, remembering, or making decisions?: No Patient able to express need for assistance with ADLs?: No Does the patient have difficulty dressing or bathing?: No Independently performs ADLs?: No Communication: Independent Dressing (OT): Independent Grooming: Independent Feeding: Independent Bathing: Independent Toileting: Independent In/Out Bed: Independent Walks in Home: Independent Does the patient have difficulty walking or climbing stairs?: No Weakness of Legs: None Weakness of Arms/Hands: None  Permission Sought/Granted Permission sought to share information with : Family Supports Permission granted to share information with : Yes, Verbal Permission Granted  Share Information with NAME: sons Erlene Quan and [place]           Emotional Assessment Appearance:: Appears stated age Attitude/Demeanor/Rapport: Engaged Affect (typically observed): Pleasant,Appropriate Orientation: : Oriented to Self,Oriented to Place,Oriented to  Time,Oriented to Situation Alcohol / Substance Use: Not Applicable Psych Involvement: No (comment)  Admission diagnosis:  Precordial pain [R07.2] Facial droop [R29.810] Transient neurologic deficit [R29.818] Generalized weakness [R53.1] Patient Active Problem List   Diagnosis Date Noted  . Transient neurologic deficit 06/18/2020  . Chest pain 06/18/2020  . Uncontrolled type 2 diabetes mellitus with hyperglycemia ([place]) 06/18/2020  . Near syncope 06/18/2020  . Total bilirubin, elevated 06/18/2020  . Cerebral thrombosis with cerebral infarction 06/18/2020   PCP:  Pcp, No Pharmacy:   [id number] - [place], [address] [place] [address] Phone: [phone] Fax: [phone]  Social Determinants of Health (SDOH) Interventions    Readmission Risk Interventions No flowsheet data found.

## 2020-06-19 NOTE — Progress Notes (Signed)
Stroke Team Progress Note  SUBJECTIVE Patient is sitting up in bed.  He states he is doing well.  He still does not fully remember the episode.  He describes a near syncope episode with feeling of nausea, dizziness and sweating and has patchy memory of the event.  He states his family describe his eyes rolling up but is not sure if there was any tonic-clonic activity.  He denies any prior history of syncope, palpitations, TIA or migraines.  MRI scan of the brain is negative for acute infarct and EEG is normal.  Telemetry monitoring so far has not shown significant cardiac arrhythmias.  2D echo shows normal ejection fraction without cardiac source of embolism.   OBJECTIVE Most recent Vital Signs: Temp: 99.3 F (37.4 C) (04/25 0538) Temp Source: Oral (04/25 0538) BP: 118/82 (04/25 0538) Pulse Rate: 85 (04/25 0538) Respiratory Rate: 20 O2 Saturdation: 95%  CBG (last 3)  Recent Labs    06/18/20 2116 06/19/20 0800 06/19/20 1140  GLUCAP 127* 108* 136*       Studies: As stated above  Physical Exam: Pleasant obese middle-aged Caucasian male not in distress.  . Afebrile. Head is nontraumatic. Neck is supple without bruit.    Cardiac exam no murmur or gallop. Lungs are clear to auscultation. Distal pulses are well felt. Neurological Exam ;  Awake  Alert oriented x 3. Normal speech and language.eye movements full without nystagmus.fundi were not visualized. Vision acuity and fields appear normal. Hearing is normal. Palatal movements are normal. Face symmetric. Tongue midline. Normal strength, tone, reflexes and coordination. Normal sensation. Gait deferred.  ASSESSMENT [name] is a 55 y.o. male with a transient episode of altered consciousness and memory loss following what sounds like a presyncopal episode.  Etiology indeterminate with a convulsive syncope versus posterior circulation TIA.  Hospital day # 0  TREATMENT/PLAN Recommend continue aspirin for stroke prevention  and aggressive stroke risk factor modification.  And hold off on anticonvulsants as this was a solitary episode.  Patient advised not to drive for next 6 months as per [facility].  Consider outpatient 30-day cardiac monitoring as well as follow-up in the stroke clinic in 6 weeks and may repeat EEG at that visit.  If patient has recurrent episodes may consider an empirical trial of anticonvulsants.  .  Greater than 50% time during this 25-minute visit was spent on counseling and coordination of care about his episode of loss of consciousness and discussion about differential diagnosis and evaluation and treatment plan and answering questions.  Discussed with Dr. Blake Divine.  Stroke team will sign off.  Kindly call for questions.  Delia Heady, MD Medical Director [facility] Pager: [phone] 06/19/2020 1:43 PM

## 2020-06-19 NOTE — Progress Notes (Signed)
Occupational Therapy Treatment Patient Details Name: Eric Bauer MRN: [id number] DOB: [birth year] Today's Date: 06/19/2020    History of present illness Pt is a 55 yo male s/p generalized weakness, chest pain, L facial droop, flat affect and nausea. Pt MRI/CT head have been negative for acute abnomality. Chest x-ray reveals low volumes with bibasilar atelectasis.  Pt PMHx:HTN, IDDM.   OT comments  Pt progressing well and cognition appears more intact. Pt's session focused on Pill Box Test and passing it with no errors. Pt performing bed mobility with no additional assist and can perform own ADL with increased time. Pt appears to be WFLs for cognition, but delayed and wondering if this is baseline. Pt close to baseline function for ADL and mobility. OT following acutely.    Follow Up Recommendations  No OT follow up;Supervision - Intermittent    Equipment Recommendations  None recommended by OT    Recommendations for Other Services      Precautions / Restrictions Precautions Precautions: Fall Restrictions Weight Bearing Restrictions: No       Mobility Bed Mobility Overal bed mobility: Modified Independent Bed Mobility: Supine to Sit     Supine to sit: Modified independent (Device/Increase time)     General bed mobility comments: increased time to sit EOB    Transfers                 General transfer comment: EOB for Pill Box Test    Balance Overall balance assessment: Needs assistance Sitting-balance support: Feet supported Sitting balance-Leahy Scale: Good     Standing balance support: No upper extremity supported;During functional activity Standing balance-Leahy Scale: Fair                             ADL either performed or assessed with clinical judgement   ADL Overall ADL's : Needs assistance/impaired Eating/Feeding: Set up;Sitting                                   Functional mobility during ADLs: Min guard;Rolling  walker;Cueing for safety General ADL Comments: Pt's session focused on Pill Box Test and passing it. Pt performing bed mobility with no additional assist and can perform own ADL with increased time. Pt appears to be WFLs for cognition, but delayed and wondering if this is baseline. EEG tech waiting to place electrodes on pt so OTR's session concluded after test.     Vision   Vision Assessment?: No apparent visual deficits;Yes Eye Alignment: Within Functional Limits Ocular Range of Motion: Within Functional Limits Alignment/Gaze Preference: Within Defined Limits Tracking/Visual Pursuits: Able to track stimulus in all quads without difficulty Additional Comments: no blurriness reported with glasses and able to read fine print   Perception     Praxis      Cognition Arousal/Alertness: Awake/alert Behavior During Therapy: Flat affect Overall Cognitive Status: No family/caregiver present to determine baseline cognitive functioning                                 General Comments: Son present yesterday and did not comment or attest to pt's cognition being different from baseline. Today, pt passed the Pill Box test within 5 mins and with no errors. Pt wearing glasses and comprehending directions. Unsure of pt's intellect prior to hospitalization.  Exercises     Shoulder Instructions       General Comments VSS on RA    Pertinent Vitals/ Pain       Pain Assessment: No/denies pain  Home Living                                          Prior Functioning/Environment              Frequency  Min 2X/week        Progress Toward Goals  OT Goals(current goals can now be found in the care plan section)  Progress towards OT goals: Progressing toward goals  Acute Rehab OT Goals Patient Stated Goal: to go home OT Goal Formulation: With patient Time For Goal Achievement: 07/02/20 Potential to Achieve Goals: Good ADL Goals Pt Will Perform  Grooming: with supervision;standing Pt/caregiver will Perform Home Exercise Program: Increased strength;Both right and left upper extremity;With Supervision Additional ADL Goal #1: Pt will increase to supervisionA for OOB ADL functional tasks x6 mins with 1 seated rest break. Additional ADL Goal #2: Pt will perform higher level cognitive tasks with 90% accuracy and minimal cues.  Plan Discharge plan remains appropriate    Co-evaluation                 AM-PAC OT "6 Clicks" Daily Activity     Outcome Measure   Help from another person eating meals?: None Help from another person taking care of personal grooming?: None Help from another person toileting, which includes using toliet, bedpan, or urinal?: None Help from another person bathing (including washing, rinsing, drying)?: A Little Help from another person to put on and taking off regular upper body clothing?: None Help from another person to put on and taking off regular lower body clothing?: None 6 Click Score: 23    End of Session    OT Visit Diagnosis: Unsteadiness on feet (R26.81);Other symptoms and signs involving cognitive function   Activity Tolerance Patient tolerated treatment well   Patient Left in bed;with call bell/phone within reach;Other (comment) (EEG tech in room)   Nurse Communication Mobility status        Time: [phone] OT Time Calculation (min): 16 min  Charges: OT General Charges $OT Visit: 1 Visit OT Treatments $Self Care/Home Management : 8-22 mins  Eric Bauer, OTR/L Acute Rehabilitation Services Pager: [phone] Office: [phone]    Eric Bauer 06/19/2020, 2:52 PM

## 2020-06-19 NOTE — Progress Notes (Signed)
Physical Therapy Treatment Patient Details Name: Eric Bauer MRN: [id number] DOB: [birth year] Today's Date: 06/19/2020    History of Present Illness Pt is a 55 yo male s/p generalized weakness, chest pain, L facial droop, flat affect and nausea. Pt MRI/CT head have been negative for acute abnomality. Chest x-ray reveals low volumes with bibasilar atelectasis.  Pt PMHx:HTN, IDDM.    PT Comments    Patient reports he still feels fatigued and not back to his baseline. He remains slightly unsteady with wide base of support with + drift left and right (partially due to his decreased velocity). He had +loss of balance as descending step with need for min assist and support of rail. Discussed having someone stay with him when discharged and he reports he plans to go stay with his son.     Follow Up Recommendations  Outpatient PT;Supervision for mobility/OOB     Equipment Recommendations  None recommended by PT    Recommendations for Other Services       Precautions / Restrictions Precautions Precautions: Fall    Mobility  Bed Mobility Overal bed mobility: Modified Independent Bed Mobility: Supine to Sit;Sit to Supine     Supine to sit: Modified independent (Device/Increase time) Sit to supine: Modified independent (Device/Increase time)   General bed mobility comments: incr time after asked to sit up and required second cue to do so    Transfers Overall transfer level: Needs assistance Equipment used: None Transfers: Sit to/from Stand Sit to Stand: Supervision         General transfer comment: supervision for safety  Ambulation/Gait Ambulation/Gait assistance: Min guard Gait Distance (Feet): 300 Feet Assistive device: None Gait Pattern/deviations: Step-through pattern;Decreased stride length;Wide base of support     General Gait Details: required cues to incr velocity as pt appeared slightly unsteady with incr lateral sway and drift; improved with incr velocity  however pt did not maintain velocity unless cued   Stairs Stairs: Yes Stairs assistance: Min assist Stair Management: One rail Right;One rail Left;Step to pattern;Forwards Number of Stairs: 10 (1x 10 using counter for rail) General stair comments: pt with loss of balance x 1 as descending 6th step with min assist and use of counter to recover   Wheelchair Mobility    Modified Rankin (Stroke Patients Only) Modified Rankin (Stroke Patients Only) Pre-Morbid Rankin Score: No symptoms Modified Rankin: Moderately severe disability     Balance Overall balance assessment: Needs assistance Sitting-balance support: Feet supported Sitting balance-Leahy Scale: Good     Standing balance support: No upper extremity supported;During functional activity Standing balance-Leahy Scale: Fair                              Cognition Arousal/Alertness: Awake/alert Behavior During Therapy: Flat affect                                   General Comments: seems slightly delayed with cognition, however unclear what pt's baseline is      Exercises      General Comments        Pertinent Vitals/Pain Pain Assessment: No/denies pain    Home Living                      Prior Function            PT Goals (current goals can now be found  in the care plan section) Acute Rehab PT Goals Patient Stated Goal: to go home Time For Goal Achievement: 07/02/20 Potential to Achieve Goals: Good Progress towards PT goals: Progressing toward goals    Frequency    Min 3X/week      PT Plan Current plan remains appropriate    Co-evaluation              AM-PAC PT "6 Clicks" Mobility   Outcome Measure  Help needed turning from your back to your side while in a flat bed without using bedrails?: None Help needed moving from lying on your back to sitting on the side of a flat bed without using bedrails?: None Help needed moving to and from a bed to a chair  (including a wheelchair)?: A Little Help needed standing up from a chair using your arms (e.g., wheelchair or bedside chair)?: A Little Help needed to walk in hospital room?: A Little Help needed climbing 3-5 steps with a railing? : A Little 6 Click Score: 20    End of Session   Activity Tolerance: Patient tolerated treatment well Patient left: with call bell/phone within reach;in chair;with chair alarm set Nurse Communication: Mobility status;Other (comment) (can benefit from another walk) PT Visit Diagnosis: Unsteadiness on feet (R26.81);Other abnormalities of gait and mobility (R26.89);Difficulty in walking, not elsewhere classified (R26.2)     Time: 1010-1026 PT Time Calculation (min) (ACUTE ONLY): 16 min  Charges:  $Gait Training: 8-22 mins                      Jerolyn Center, PT Pager [phone]    Zena Amos 06/19/2020, 10:54 AM

## 2020-06-20 NOTE — Progress Notes (Signed)
Referring-Vijaya Akula MD Reason for referral-chest pain and presyncope  HPI: 55 year old male for evaluation of chest pain and presyncope at request of Kathlen Mody MD. Patient previously seen in [facility] by Jeralene Huff MD.  There was a question of atrial septal defect.  However transesophageal echocardiogram December 2019 showed normal LV function, mild right ventricular enlargement, no atrial septal defect or other congenital abnormalities and no valvular heart disease noted.  Recently admitted with episode altered consciousness and memory loss/presyncope; also with chest pain and question left facial droop.  Echocardiogram April 2022 showed normal LV function.  EEG was normal.  Brain MRI normal.  CTA of the head and neck showed no disease.  Outpatient monitor was recommended.  Note troponins and Ddimer were normal.  Patient also complained of chest pain and cardiology asked to evaluate.  Patient states that he typically does not have dyspnea on exertion, exertional chest pain, orthopnea, PND, pedal edema or syncope.  On the day of his admission he was at [employer].  He suddenly developed nausea, dizziness and near syncope (not clear that he lost consciousness).  He is unclear about the duration of his symptoms.  He felt general weakness.  He also had chest pain that was diffuse and radiated to his shoulders.  Unclear how long the pain lasted.  He has had some dyspnea on exertion since that time and also some chest pain.  No syncope.  Cardiology now asked to evaluate.  Current Outpatient Medications  Medication Sig Dispense Refill  . aspirin EC 81 MG EC tablet Take 1 tablet (81 mg total) by mouth daily. Swallow whole. 30 tablet 11  . famotidine (PEPCID) 40 MG tablet Take 40 mg by mouth daily.    Marland Kitchen JANUMET 50-1000 MG tablet Take 1 tablet by mouth 2 (two) times daily with a meal.    . lisinopril (ZESTRIL) 5 MG tablet Take 5 mg by mouth daily.    . rosuvastatin (CRESTOR) 40 MG tablet Take  40 mg by mouth daily.    . tamsulosin (FLOMAX) 0.4 MG CAPS capsule Take 0.8 mg by mouth daily.    [name] FLEXTOUCH 100 UNIT/ML FlexTouch Pen Inject 46 Units into the skin at bedtime.     No current facility-administered medications for this visit.    No Known Allergies   Past Medical History:  Diagnosis Date  . Diabetes mellitus (HCC)   . Hyperlipidemia   . Hypertension     Past Surgical History:  Procedure Laterality Date  . No prior surgery      Social History   Socioeconomic History  . Marital status: Married    Spouse name: Not on file  . Number of children: 5  . Years of education: Not on file  . Highest education level: Not on file  Occupational History  . Not on file  Tobacco Use  . Smoking status: Never Smoker  . Smokeless tobacco: Never Used  Substance and Sexual Activity  . Alcohol use: Not Currently  . Drug use: Not on file  . Sexual activity: Not on file  Other Topics Concern  . Not on file  Social History Narrative  . Not on file   Social Determinants of Health   Financial Resource Strain: Not on file  Food Insecurity: Not on file  Transportation Needs: Not on file  Physical Activity: Not on file  Stress: Not on file  Social Connections: Not on file  Intimate Partner Violence: Not on file  Family History  Problem Relation Age of Onset  . Liver cancer Mother   . Heart attack Father     ROS: no fevers or chills, productive cough, hemoptysis, dysphasia, odynophagia, melena, hematochezia, dysuria, hematuria, rash, seizure activity, orthopnea, PND, pedal edema, claudication. Remaining systems are negative.  Physical Exam:   Blood pressure 112/82, pulse 84, height 5' 8.5" (1.74 m), weight 190 lb 6.4 oz (86.4 kg), SpO2 99 %.  General:  Well developed/well nourished in NAD Skin warm/dry Patient not depressed No peripheral clubbing Back-normal HEENT-normal/normal eyelids Neck supple/normal carotid upstroke bilaterally; no bruits; no  JVD; no thyromegaly chest - CTA/ normal expansion CV - RRR/normal S1 and S2; no murmurs, rubs or gallops;  PMI nondisplaced Abdomen -NT/ND, no HSM, no mass, + bowel sounds, no bruit 2+ femoral pulses, no bruits Ext-no edema, chords, 2+ DP Neuro-grossly nonfocal  ECG -June 17, 2020-sinus rhythm with no ST changes.  Personally reviewed  Electrocardiogram today shows sinus rhythm with no ST changes, personally reviewed.  A/P  1 chest pain-symptoms are somewhat atypical.  Electrocardiogram shows no ST changes.  Recent enzymes negative.  Risk factors include diabetes mellitus.  We will arrange cardiac CTA to rule out obstructive coronary disease.  Note he also describes some dyspnea on exertion though LV function was normal.  D-dimer was also normal.  2 episode of presyncope-we will arrange outpatient 30-day monitor.  Patient has had no recurrences since discharge.  3 hyperlipidemia-continue statin.  Olga Millers, MD

## 2020-06-20 NOTE — Discharge Summary (Signed)
Physician Discharge Summary  Eric Bauer GMW:[id number] DOB: 1965/08/13 DOA: 06/17/2020  PCP: Oneita Hurt, No  Admit date: 06/17/2020 Discharge date: 06/19/2020  Admitted From:  Home Disposition: Home.   Recommendations for Outpatient Follow-up:  1. Follow up with PCP in 1-2 weeks 2. Please obtain BMP/CBC in one week Please follow up with Neurology as recommended.    Discharge Condition: stable.  CODE STATUS: full code.  Diet recommendation: Heart Healthy   Brief/Interim Summary: Eric Hudspethis an 55 y.o.malewith a PMHx of DM and HTN, presenting acutely from [facility] via EMS after having a near-syncopal episode in conjunction with diaphoresis and upper extremity tremor prior to eating. He also started to experience nausea, SOB and H/A.  Stroke is ruled out.  Pt seen and examined at bedside.  EEG ordered for further evaluation. It is wnl. No seizures or epileptiform discharges were seen through out the recording. Telemetry monitoring is negative for significant arrhythmias. 2 d echocardiogram showed normal LV EF , without regional wall abn and without any embolism.  Neurology consulted, recommended to continue the aspirin for stroke prevention and hold off on the AED'S as this was a solitary episode,. Recommended to follow up with cardio for 30 day cardiac monitoring. Recommend outpatient follow up with neurology and repeating EEG at the visit.    Discharge Diagnoses:  Principal Problem:   Transient neurologic deficit Active Problems:   Chest pain   Uncontrolled type 2 diabetes mellitus with hyperglycemia (HCC)   Near syncope   Total bilirubin, elevated   Cerebral thrombosis with cerebral infarction    Discharge Instructions  Discharge Instructions    Ambulatory referral to Physical Therapy   Complete by: As directed    Diet - low sodium heart healthy   Complete by: As directed    Discharge instructions   Complete by: As directed    Please get  follow up with cardiology for 30 day event monitor placement.  Please follow up with Dr Pearlean Brownie in 6 weeks.  You will need another EEG at the time of the visit.   Increase activity slowly   Complete by: As directed      Allergies as of 06/19/2020   No Known Allergies     Medication List    TAKE these medications   aspirin 81 MG EC tablet Take 1 tablet (81 mg total) by mouth daily. Swallow whole.   famotidine 40 MG tablet Commonly known as: PEPCID Take 40 mg by mouth daily.   Janumet 50-1000 MG tablet Generic drug: sitaGLIPtin-metformin Take 1 tablet by mouth 2 (two) times daily with a meal.   lisinopril 5 MG tablet Commonly known as: ZESTRIL Take 5 mg by mouth daily.   rosuvastatin 40 MG tablet Commonly known as: CRESTOR Take 40 mg by mouth daily.   tamsulosin 0.4 MG Caps capsule Commonly known as: FLOMAX Take 0.8 mg by mouth daily.   [name] FlexTouch 100 UNIT/ML FlexTouch Pen Generic drug: insulin degludec Inject 46 Units into the skin at bedtime.       Follow-up Information    [facility]. Schedule an appointment as soon as possible for a visit in 1 week(s).   Specialty: Cardiology Why: 30 day event monitor placement.  Contact information: [address], [address] [place] [address] [phone]       Micki Riley, MD Follow up in 6 week(s).   Specialties: Neurology, Radiology Contact information: [address] [place] [address] [phone]  [facility] Follow up.   Specialty: Rehabilitation Why: The clinic will call you to schedule your first physical therapy session.  Please contact them if you have not heard from them in 48 hours.  Contact information: [address]  [address] [id number] [place] [address] [address] [phone]             No Known Allergies  Consultations:  Neurology.    Procedures/Studies: CT  Angio Head W or Wo Contrast  Result Date: 06/17/2020 CLINICAL DATA:  Stroke/TIA, assess intracranial arteries; Stroke/TIA, assess intracranial arteries Stroke/TIA, assess extracranial arteries EXAM: CT ANGIOGRAPHY HEAD AND NECK TECHNIQUE: Multidetector CT imaging of the head and neck was performed using the standard protocol during bolus administration of intravenous contrast. Multiplanar CT image reconstructions and MIPs were obtained to evaluate the vascular anatomy. Carotid stenosis measurements (when applicable) are obtained utilizing NASCET criteria, using the distal internal carotid diameter as the denominator. CONTRAST:  75mL OMNIPAQUE IOHEXOL 350 MG/ML SOLN COMPARISON:  None. FINDINGS: CTA NECK FINDINGS SKELETON: There is no bony spinal canal stenosis. No lytic or blastic lesion. OTHER NECK: Normal pharynx, larynx and major salivary glands. No cervical lymphadenopathy. Unremarkable thyroid gland. UPPER CHEST: No pneumothorax or pleural effusion. No nodules or masses. AORTIC ARCH: There is no calcific atherosclerosis of the aortic arch. There is no aneurysm, dissection or hemodynamically significant stenosis of the visualized portion of the aorta. Conventional 3 vessel aortic branching pattern. The visualized proximal subclavian arteries are widely patent. RIGHT CAROTID SYSTEM: Normal without aneurysm, dissection or stenosis. LEFT CAROTID SYSTEM: Normal without aneurysm, dissection or stenosis. VERTEBRAL ARTERIES: Left dominant configuration. Both origins are clearly patent. There is no dissection, occlusion or flow-limiting stenosis to the skull base (V1-V3 segments). CTA HEAD FINDINGS POSTERIOR CIRCULATION: --Vertebral arteries: Normal V4 segments. --Inferior cerebellar arteries: Normal. --Basilar artery: Normal. --Superior cerebellar arteries: Normal. --Posterior cerebral arteries (PCA): Normal. The right PCA is partially supplied by a posterior communicating artery (p-comm). ANTERIOR CIRCULATION:  --Intracranial internal carotid arteries: Normal. --Anterior cerebral arteries (ACA): Normal. Both A1 segments are present. Patent anterior communicating artery (a-comm). --Middle cerebral arteries (MCA): Normal. VENOUS SINUSES: As permitted by contrast timing, patent. ANATOMIC VARIANTS: None Review of the MIP images confirms the above findings. IMPRESSION: Normal CTA of the head and neck. Electronically Signed   By: Deatra Robinson M.D.   On: 06/17/2020 23:55   CT Angio Neck W and/or Wo Contrast  Result Date: 06/17/2020 CLINICAL DATA:  Stroke/TIA, assess intracranial arteries; Stroke/TIA, assess intracranial arteries Stroke/TIA, assess extracranial arteries EXAM: CT ANGIOGRAPHY HEAD AND NECK TECHNIQUE: Multidetector CT imaging of the head and neck was performed using the standard protocol during bolus administration of intravenous contrast. Multiplanar CT image reconstructions and MIPs were obtained to evaluate the vascular anatomy. Carotid stenosis measurements (when applicable) are obtained utilizing NASCET criteria, using the distal internal carotid diameter as the denominator. CONTRAST:  75mL OMNIPAQUE IOHEXOL 350 MG/ML SOLN COMPARISON:  None. FINDINGS: CTA NECK FINDINGS SKELETON: There is no bony spinal canal stenosis. No lytic or blastic lesion. OTHER NECK: Normal pharynx, larynx and major salivary glands. No cervical lymphadenopathy. Unremarkable thyroid gland. UPPER CHEST: No pneumothorax or pleural effusion. No nodules or masses. AORTIC ARCH: There is no calcific atherosclerosis of the aortic arch. There is no aneurysm, dissection or hemodynamically significant stenosis of the visualized portion of the aorta. Conventional 3 vessel aortic branching pattern. The visualized proximal subclavian arteries are widely patent. RIGHT CAROTID SYSTEM: Normal without aneurysm, dissection or stenosis.  LEFT CAROTID SYSTEM: Normal without aneurysm, dissection or stenosis. VERTEBRAL ARTERIES: Left dominant configuration.  Both origins are clearly patent. There is no dissection, occlusion or flow-limiting stenosis to the skull base (V1-V3 segments). CTA HEAD FINDINGS POSTERIOR CIRCULATION: --Vertebral arteries: Normal V4 segments. --Inferior cerebellar arteries: Normal. --Basilar artery: Normal. --Superior cerebellar arteries: Normal. --Posterior cerebral arteries (PCA): Normal. The right PCA is partially supplied by a posterior communicating artery (p-comm). ANTERIOR CIRCULATION: --Intracranial internal carotid arteries: Normal. --Anterior cerebral arteries (ACA): Normal. Both A1 segments are present. Patent anterior communicating artery (a-comm). --Middle cerebral arteries (MCA): Normal. VENOUS SINUSES: As permitted by contrast timing, patent. ANATOMIC VARIANTS: None Review of the MIP images confirms the above findings. IMPRESSION: Normal CTA of the head and neck. Electronically Signed   By: Deatra Robinson M.D.   On: 06/17/2020 23:55   MR BRAIN WO CONTRAST  Result Date: 06/18/2020 CLINICAL DATA:  TIA.  Acute onset of fatigue and lightheadedness EXAM: MRI HEAD WITHOUT CONTRAST TECHNIQUE: Multiplanar, multiecho pulse sequences of the brain and surrounding structures were obtained without intravenous contrast. COMPARISON:  Head CT and CTA from yesterday FINDINGS: Brain: No acute infarction, hemorrhage, hydrocephalus, extra-axial collection or mass lesion. 1 or 2 small remote white matter insults - considered allowable for age. Normal brain volume. Vascular: Normal flow voids Skull and upper cervical spine: Normal marrow signal. Sinuses/Orbits: Mild generalized mucosal thickening in paranasal sinuses. IMPRESSION: No acute finding or explanation for symptoms. Electronically Signed   By: Marnee Spring M.D.   On: 06/18/2020 04:30   DG Chest Port 1 View  Result Date: 06/17/2020 CLINICAL DATA:  Shortness of breath EXAM: PORTABLE CHEST 1 VIEW COMPARISON:  None. FINDINGS: The heart size and mediastinal contours are within normal  limits. Low lung volumes with bibasilar atelectasis. No focal consolidation. No visible pleural effusion or pneumothorax. The visualized skeletal structures are unremarkable. IMPRESSION: Low lung volumes with bibasilar atelectasis. No focal consolidation. Electronically Signed   By: Maudry Mayhew MD   On: 06/17/2020 21:44   EEG adult  Result Date: 06/19/2020 Charlsie Quest, MD     06/19/2020 10:09 AM Patient Name: Eric Bauer MRN: [id number] Epilepsy Attending: Charlsie Quest Referring Physician/Provider: Dr Beryle Beams Date: 06/19/2020 Duration: 22.43 mins Patient history: Selma Hudspethis an 54 y.o.malewho presented after having a near-syncopal episode in conjunction with diaphoresis and upper extremity tremor prior to eating. EEG to evaluate for seizure Level of alertness: Awake, drowsy AEDs during EEG study: None Technical aspects: This EEG study was done with scalp electrodes positioned according to the 10-20 International system of electrode placement. Electrical activity was acquired at a sampling rate of 500Hz  and reviewed with a high frequency filter of 70Hz  and a low frequency filter of 1Hz . EEG data were recorded continuously and digitally stored. Description: The posterior dominant rhythm consists of 9 Hz activity of moderate voltage (25-35 uV) seen predominantly in posterior head regions, symmetric and reactive to eye opening and eye closing. Drowsiness was characterized by attenuation of the posterior background rhythm. Physiologic photic driving was not seen during photic stimulation. Hyperventilation was not performed.   IMPRESSION: This study is within normal limits. No seizures or epileptiform discharges were seen throughout the recording.   ECHOCARDIOGRAM COMPLETE  Result Date: 06/18/2020    ECHOCARDIOGRAM REPORT   Patient Name:   Eric Bauer Date of Exam: 06/18/2020 Medical Rec #:  06/20/2020        Height:       68.0 in Accession #:  [id number]        Weight:       202.4 lb Date of Birth:  [birth year]        BSA:          2.054 m Patient Age:    54 years         BP:           96/64 mmHg Patient Gender: M                HR:           91 bpm. Exam Location:  Inpatient Procedure: 2D Echo Indications:    chest pain. TIA.  History:        Patient has no prior history of Echocardiogram examinations.                 Risk Factors:Diabetes and Dyslipidemia.  Sonographer:    Delcie RochLauren Pennington Referring Phys: [id number] Rashawn S OPYD IMPRESSIONS  1. Left ventricular ejection fraction, by estimation, is 60 to 65%. The left ventricle has normal function. The left ventricle has no regional wall motion abnormalities. Left ventricular diastolic parameters were normal.  2. Right ventricular systolic function is normal. The right ventricular size is normal. Tricuspid regurgitation signal is inadequate for assessing PA pressure.  3. The mitral valve is normal in structure. Trivial mitral valve regurgitation. No evidence of mitral stenosis.  4. The aortic valve is tricuspid. Aortic valve regurgitation is not visualized. No aortic stenosis is present.  5. The inferior vena cava is normal in size with greater than 50% respiratory variability, suggesting right atrial pressure of 3 mmHg. FINDINGS  Left Ventricle: Left ventricular ejection fraction, by estimation, is 60 to 65%. The left ventricle has normal function. The left ventricle has no regional wall motion abnormalities. The left ventricular internal cavity size was normal in size. There is  no left ventricular hypertrophy. Left ventricular diastolic parameters were normal. Right Ventricle: The right ventricular size is normal.Right ventricular systolic function is normal. Tricuspid regurgitation signal is inadequate for assessing PA pressure. The tricuspid regurgitant velocity is 2.28 m/s, and with an assumed right atrial pressure of 3 mmHg, the estimated right ventricular systolic pressure is 23.8 mmHg. Left Atrium: Left atrial  size was normal in size. Right Atrium: Right atrial size was normal in size. Pericardium: There is no evidence of pericardial effusion. Mitral Valve: The mitral valve is normal in structure. Trivial mitral valve regurgitation. No evidence of mitral valve stenosis. Tricuspid Valve: The tricuspid valve is normal in structure. Tricuspid valve regurgitation is trivial. No evidence of tricuspid stenosis. Aortic Valve: The aortic valve is tricuspid. Aortic valve regurgitation is not visualized. No aortic stenosis is present. Pulmonic Valve: The pulmonic valve was normal in structure. Pulmonic valve regurgitation is not visualized. No evidence of pulmonic stenosis. Aorta: The aortic root is normal in size and structure. Venous: The inferior vena cava is normal in size with greater than 50% respiratory variability, suggesting right atrial pressure of 3 mmHg. IAS/Shunts: No atrial level shunt detected by color flow Doppler.  LEFT VENTRICLE PLAX 2D LVIDd:         4.80 cm Diastology LVIDs:         2.70 cm LV e' medial:    11.60 cm/s LV PW:         1.10 cm LV E/e' medial:  6.9 LV IVS:        1.00 cm LV e' lateral:   12.80 cm/s  LV E/e' lateral: 6.3  RIGHT VENTRICLE             IVC RV S prime:     18.90 cm/s  IVC diam: 1.80 cm TAPSE (M-mode): 2.3 cm LEFT ATRIUM             Index       RIGHT ATRIUM          Index LA diam:        4.20 cm 2.04 cm/m  RA Area:     9.86 cm LA Vol (A2C):   28.8 ml 14.02 ml/m RA Volume:   17.70 ml 8.62 ml/m LA Vol (A4C):   32.0 ml 15.58 ml/m LA Biplane Vol: 31.5 ml 15.34 ml/m  AORTIC VALVE LVOT Vmax:   127.00 cm/s LVOT Vmean:  80.800 cm/s LVOT VTI:    0.196 m  AORTA Ao Asc diam: 3.50 cm MITRAL VALVE               TRICUSPID VALVE MV Area (PHT): 5.54 cm    TR Peak grad:   20.8 mmHg MV Decel Time: 137 msec    TR Vmax:        228.00 cm/s MV E velocity: 80.20 cm/s MV A velocity: 65.50 cm/s  SHUNTS MV E/A ratio:  1.22        Systemic VTI: 0.20 m Olga Millers MD Electronically  signed by Olga Millers MD Signature Date/Time: 06/18/2020/11:30:19 AM    Final    CT HEAD CODE STROKE WO CONTRAST  Result Date: 06/17/2020 CLINICAL DATA:  Code stroke. EXAM: CT HEAD WITHOUT CONTRAST TECHNIQUE: Contiguous axial images were obtained from the base of the skull through the vertex without intravenous contrast. COMPARISON:  Head CT 07/18/2019 FINDINGS: Brain: There is no mass, hemorrhage or extra-axial collection. The size and configuration of the ventricles and extra-axial CSF spaces are normal. The brain parenchyma is normal, without evidence of acute or chronic infarction. Vascular: No abnormal hyperdensity of the major intracranial arteries or dural venous sinuses. No intracranial atherosclerosis. Skull: The visualized skull base, calvarium and extracranial soft tissues are normal. Sinuses/Orbits: No fluid levels or advanced mucosal thickening of the visualized paranasal sinuses. No mastoid or middle ear effusion. The orbits are normal. ASPECTS [facility] Stroke Program Early CT Score) - Ganglionic level infarction (caudate, lentiform nuclei, internal capsule, insula, M1-M3 cortex): 7 - Supraganglionic infarction (M4-M6 cortex): 3 Total score (0-10 with 10 being normal): 10 IMPRESSION: 1. Normal head CT. 2. ASPECTS is 10. These results were communicated to Dr. Caryl Pina at 8:59 pm on 06/17/2020 by text page via the [facility] messaging system. Electronically Signed   By: Deatra Robinson M.D.   On: 06/17/2020 21:00       Subjective: No new complaints.   Discharge Exam: Vitals:   06/19/20 0538 06/19/20 1417  BP: 118/82 118/83  Pulse: 85 79  Resp:  20  Temp: 99.3 F (37.4 C) 98.9 F (37.2 C)  SpO2: 95% 98%   Vitals:   06/18/20 1500 06/19/20 0050 06/19/20 0538 06/19/20 1417  BP: 109/69 105/68 118/82 118/83  Pulse: 90 80 85 79  Resp:  20  20  Temp: 99.6 F (37.6 C) 98.5 F (36.9 C) 99.3 F (37.4 C) 98.9 F (37.2 C)  TempSrc: Oral Oral Oral Oral  SpO2: 98% 95% 95% 98%  Weight:       Height:        General: Pt is alert, awake, not in acute distress Cardiovascular: RRR, S1/S2 +, no  rubs, no gallops Respiratory: CTA bilaterally, no wheezing, no rhonchi Abdominal: Soft, NT, ND, bowel sounds + Extremities: no edema, no cyanosis    The results of significant diagnostics from this hospitalization (including imaging, microbiology, ancillary and laboratory) are listed below for reference.     Microbiology: Recent Results (from the past 240 hour(s))  SARS CORONAVIRUS 2 (TAT 6-24 HRS) Nasopharyngeal Nasopharyngeal Swab     Status: None   Collection Time: 06/18/20 12:39 AM   Specimen: Nasopharyngeal Swab  Result Value Ref Range Status   SARS Coronavirus 2 NEGATIVE NEGATIVE Final    Comment: (NOTE) SARS-CoV-2 target nucleic acids are NOT DETECTED.  The SARS-CoV-2 RNA is generally detectable in upper and lower respiratory specimens during the acute phase of infection. Negative results do not preclude SARS-CoV-2 infection, do not rule out co-infections with other pathogens, and should not be used as the sole basis for treatment or other patient management decisions. Negative results must be combined with clinical observations, patient history, and epidemiological information. The expected result is Negative.  Fact Sheet for Patients: HairSlick.no  Fact Sheet for Healthcare Providers: quierodirigir.com  This test is not yet approved or cleared by the [place] FDA and  has been authorized for detection and/or diagnosis of SARS-CoV-2 by FDA under an Emergency Use Authorization (EUA). This EUA will remain  in effect (meaning this test can be used) for the duration of the COVID-19 declaration under Se ction 564(b)(1) of the Act, 21 U.S.C. section 360bbb-3(b)(1), unless the authorization is terminated or revoked sooner.  Performed at [facility], [address] [place], [place] [address]       Labs: BNP (last 3 results) No results for input(s): BNP in the last 8760 hours. Basic Metabolic Panel: Recent Labs  Lab 06/17/20 2041 06/17/20 2120 06/18/20 0425  NA 135 138 135  K 3.9 3.9 3.9  CL 101 101 104  CO2 24  --  22  GLUCOSE 183* 178* 196*  BUN CREATININE 0.95 0.90 0.92  CALCIUM 9.1  --  8.6*   Liver Function Tests: Recent Labs  Lab 06/17/20 2041 06/18/20 0425  AST 19 18  ALT 19 17  ALKPHOS 42 38  BILITOT 2.1* 2.1*  PROT 7.0 6.5  ALBUMIN 4.2 3.7   Recent Labs  Lab 06/18/20 0425  LIPASE 39   No results for input(s): AMMONIA in the last 168 hours. CBC: Recent Labs  Lab 06/17/20 2041 06/17/20 2120 06/18/20 0425  WBC 16.3*  --  15.9*  NEUTROABS 13.9*  --   --   HGB 15.4 15.3 15.0  HCT 46.9 45.0 44.7  MCV 93.1  --  92.0  PLT 244  --  225   Cardiac Enzymes: No results for input(s): CKTOTAL, CKMB, CKMBINDEX, TROPONINI in the last 168 hours. BNP: Invalid input(s): POCBNP CBG: Recent Labs  Lab 06/18/20 1150 06/18/20 1608 06/18/20 2116 06/19/20 0800 06/19/20 1140  GLUCAP 137* 182* 127* 108* 136*   D-Dimer No results for input(s): DDIMER in the last 72 hours. Hgb A1c Recent Labs    06/18/20 0425  HGBA1C 9.3*   Lipid Profile Recent Labs    06/18/20 0425  CHOL 93  HDL 34*  LDLCALC 47  TRIG 62  CHOLHDL 2.7   Thyroid function studies No results for input(s): TSH, T4TOTAL, T3FREE, THYROIDAB in the last 72 hours.  Invalid input(s): FREET3 Anemia work up No results for input(s): VITAMINB12, FOLATE, FERRITIN, TIBC, IRON, RETICCTPCT in the last 72 hours.  Urinalysis    Component Value Date/Time   COLORURINE YELLOW 06/18/2020 0039   APPEARANCEUR HAZY (A) 06/18/2020 0039   LABSPEC 1.034 (H) 06/18/2020 0039   PHURINE 6.0 06/18/2020 0039   GLUCOSEU 50 (A) 06/18/2020 0039   HGBUR NEGATIVE 06/18/2020 0039   BILIRUBINUR NEGATIVE 06/18/2020 0039   KETONESUR 5 (A) 06/18/2020 0039   PROTEINUR NEGATIVE 06/18/2020 0039   NITRITE  NEGATIVE 06/18/2020 0039   LEUKOCYTESUR NEGATIVE 06/18/2020 0039   Sepsis Labs Invalid input(s): PROCALCITONIN,  WBC,  LACTICIDVEN Microbiology Recent Results (from the past 240 hour(s))  SARS CORONAVIRUS 2 (TAT 6-24 HRS) Nasopharyngeal Nasopharyngeal Swab     Status: None   Collection Time: 06/18/20 12:39 AM   Specimen: Nasopharyngeal Swab  Result Value Ref Range Status   SARS Coronavirus 2 NEGATIVE NEGATIVE Final    Comment: (NOTE) SARS-CoV-2 target nucleic acids are NOT DETECTED.  The SARS-CoV-2 RNA is generally detectable in upper and lower respiratory specimens during the acute phase of infection. Negative results do not preclude SARS-CoV-2 infection, do not rule out co-infections with other pathogens, and should not be used as the sole basis for treatment or other patient management decisions. Negative results must be combined with clinical observations, patient history, and epidemiological information. The expected result is Negative.  Fact Sheet for Patients: HairSlick.no  Fact Sheet for Healthcare Providers: quierodirigir.com  This test is not yet approved or cleared by the [place] FDA and  has been authorized for detection and/or diagnosis of SARS-CoV-2 by FDA under an Emergency Use Authorization (EUA). This EUA will remain  in effect (meaning this test can be used) for the duration of the COVID-19 declaration under Se ction 564(b)(1) of the Act, 21 U.S.C. section 360bbb-3(b)(1), unless the authorization is terminated or revoked sooner.  Performed at [facility], [address] [place], [place] [address]      Time coordinating discharge: 32 minutes  SIGNED:   Kathlen Mody, MD  [facility] 06/20/2020, 8:49 PM

## 2020-06-21 ENCOUNTER — Ambulatory Visit: Payer: PRIVATE HEALTH INSURANCE | Admitting: Cardiology

## 2020-06-21 ENCOUNTER — Telehealth: Payer: Self-pay

## 2020-06-21 ENCOUNTER — Other Ambulatory Visit: Payer: Self-pay

## 2020-06-21 ENCOUNTER — Encounter: Payer: Self-pay | Admitting: Cardiology

## 2020-06-21 VITALS — BP 112/82 | HR 84 | Ht 68.5 in | Wt 190.4 lb

## 2020-06-21 DIAGNOSIS — E7800 Pure hypercholesterolemia, unspecified: Secondary | ICD-10-CM | POA: Diagnosis not present

## 2020-06-21 DIAGNOSIS — R072 Precordial pain: Secondary | ICD-10-CM

## 2020-06-21 DIAGNOSIS — R55 Syncope and collapse: Secondary | ICD-10-CM | POA: Diagnosis not present

## 2020-06-21 MED ORDER — METOPROLOL TARTRATE 100 MG PO TABS: ORAL_TABLET | ORAL | 0 refills | Status: DC

## 2020-06-21 NOTE — Patient Instructions (Signed)
Testing/Procedures:  Your cardiac CT will be scheduled at one of the below locations:   [facility] [address] [place], [place] [address] [phone]   If scheduled at [facility], please arrive at the [facility] main entrance (entrance A) of [facility] 30 minutes prior to test start time. Proceed to the [facility] Radiology Department (first floor) to check-in and test prep.  Please follow these instructions carefully (unless otherwise directed):  Hold all erectile dysfunction medications at least 3 days (72 hrs) prior to test.  On the Night Before the Test: . Be sure to Drink plenty of water. . Do not consume any caffeinated/decaffeinated beverages or chocolate 12 hours prior to your test. . Do not take any antihistamines 12 hours prior to your test.   On the Day of the Test: . Drink plenty of water until 1 hour prior to the test. . Do not eat any food 4 hours prior to the test. . You may take your regular medications prior to the test.  . Take metoprolol (Lopressor) 100 mg two hours prior to test. . HOLD Furosemide/Hydrochlorothiazide morning of the test. . FEMALES- please wear underwire-free bra if available       After the Test: . Drink plenty of water. . After receiving IV contrast, you may experience a mild flushed feeling. This is normal. . On occasion, you may experience a mild rash up to 24 hours after the test. This is not dangerous. If this occurs, you can take Benadryl 25 mg and increase your fluid intake. . If you experience trouble breathing, this can be serious. If it is severe call 911 IMMEDIATELY. If it is mild, please call our office. . If you take any of these medications: Glipizide/Metformin, Avandament, Glucavance, please do not take 48 hours after completing test unless otherwise instructed.   Once we have confirmed authorization from your insurance company, we will call you to set up a date and time for your  test. Based on how quickly your insurance processes prior authorizations requests, please allow up to 4 weeks to be contacted for scheduling your Cardiac CT appointment. Be advised that routine Cardiac CT appointments could be scheduled as many as 8 weeks after your provider has ordered it.  For non-scheduling related questions, please contact the cardiac imaging nurse navigator should you have any questions/concerns: Rockwell Alexandria, Cardiac Imaging Nurse Navigator Larey Brick, Cardiac Imaging Nurse Navigator  [facility] Services Direct Office Dial: [phone]   For scheduling needs, including cancellations and rescheduling, please call [place], [phone].   Your physician has recommended that you wear an event monitor. Event monitors are medical devices that record the heart's electrical activity. Doctors most often [place] these monitors to diagnose arrhythmias. Arrhythmias are problems with the speed or rhythm of the heartbeat. The monitor is a small, portable device. You can wear one while you do your normal daily activities. This is usually used to diagnose what is causing palpitations/syncope (passing out).    Follow-Up: At [facility], you and your health needs are our priority.  As part of our continuing mission to provide you with exceptional heart care, we have created designated Provider Care Teams.  These Care Teams include your primary Cardiologist (physician) and Advanced Practice Providers (APPs -  Physician Assistants and Nurse Practitioners) who all work together to provide you with the care you need, when you need it.  We recommend signing up for the patient portal called "MyChart".  Sign up information is provided on this After Visit Summary.  MyChart is used to connect with patients for Virtual Visits (Telemedicine).  Patients are able to view lab/test results, encounter notes, upcoming appointments, etc.  Non-urgent messages can be sent to your provider as  well.   To learn more about what you can do with MyChart, go to [email or web address].    Your next appointment:   4 month(s)  The format for your next appointment:   In Person  Provider:   Olga Millers, MD

## 2020-06-21 NOTE — Telephone Encounter (Signed)
[employer] Event Monitor registered to be mailed to pt's home address.

## 2020-06-22 ENCOUNTER — Telehealth: Payer: Self-pay | Admitting: Cardiology

## 2020-06-22 NOTE — Telephone Encounter (Addendum)
[facility] received forms to be completed for disability, forms were put in providers box on 4/28.

## 2020-06-23 ENCOUNTER — Telehealth: Payer: Self-pay | Admitting: Cardiology

## 2020-06-23 NOTE — Telephone Encounter (Signed)
Contacted patient to stop by [facility] office to sign release and pay $29 forms fee (cash, check, or money order) . He stated he will stop by the office when he can.

## 2020-06-26 NOTE — Telephone Encounter (Signed)
Discussed with Victorio Palm RN who filled out paperwork and patient cleared from cardiac standpoint to return to work Advised to contact neurology as well regarding clearance to return

## 2020-06-26 NOTE — Telephone Encounter (Signed)
Eric Bauer is calling stating he is wanting to know if he is fully released to go back to work yet or not. He states if he submits these forms before he is released he will be unable to receive disability. Please advise.

## 2020-06-28 ENCOUNTER — Ambulatory Visit (INDEPENDENT_AMBULATORY_CARE_PROVIDER_SITE_OTHER): Payer: PRIVATE HEALTH INSURANCE

## 2020-06-28 DIAGNOSIS — R55 Syncope and collapse: Secondary | ICD-10-CM | POA: Diagnosis not present

## 2020-07-07 ENCOUNTER — Ambulatory Visit: Payer: PRIVATE HEALTH INSURANCE

## 2020-07-28 ENCOUNTER — Other Ambulatory Visit: Payer: Self-pay | Admitting: *Deleted

## 2020-07-28 DIAGNOSIS — R072 Precordial pain: Secondary | ICD-10-CM

## 2020-07-28 LAB — BASIC METABOLIC PANEL
BUN/Creatinine Ratio: 13 (ref 9–20)
BUN: 12 mg/dL (ref 6–24)
CO2: 22 mmol/L (ref 20–29)
Calcium: 10.3 mg/dL — ABNORMAL HIGH (ref 8.7–10.2)
Chloride: 99 mmol/L (ref 96–106)
Creatinine, Ser: 0.9 mg/dL (ref 0.76–1.27)
Glucose: 253 mg/dL — ABNORMAL HIGH (ref 65–99)
Potassium: 4.1 mmol/L (ref 3.5–5.2)
Sodium: 138 mmol/L (ref 134–144)
eGFR: 102 mL/min/{1.73_m2} (ref >59)

## 2020-07-31 ENCOUNTER — Encounter: Payer: Self-pay | Admitting: *Deleted

## 2020-08-03 ENCOUNTER — Telehealth (HOSPITAL_COMMUNITY): Payer: Self-pay | Admitting: Emergency Medicine

## 2020-08-03 NOTE — Telephone Encounter (Signed)
Patient paperwork was not faxed, patient did not give correct fax number. Contacted patient to get correct number, have not gotten response back.

## 2020-08-03 NOTE — Telephone Encounter (Signed)
Reaching out to patient to offer assistance regarding upcoming cardiac imaging study; pt verbalizes understanding of appt date/time, parking situation and where to check in, pre-test NPO status and medications ordered, and verified current allergies; name and call back number provided for further questions should they arise Kynsie Falkner RN Navigator Cardiac Imaging [facility] [phone] office [phone] cell  100mg metoprolol tart  

## 2020-08-04 ENCOUNTER — Other Ambulatory Visit: Payer: Self-pay | Admitting: Internal Medicine

## 2020-08-04 ENCOUNTER — Ambulatory Visit (HOSPITAL_COMMUNITY)
Admission: RE | Admit: 2020-08-04 | Discharge: 2020-08-04 | Disposition: A | Discharge status: Home / Self Care | Payer: PRIVATE HEALTH INSURANCE | Source: Ambulatory Visit | Attending: Cardiology | Admitting: Cardiology

## 2020-08-04 ENCOUNTER — Ambulatory Visit (HOSPITAL_COMMUNITY)
Admission: RE | Admit: 2020-08-04 | Discharge: 2020-08-04 | Disposition: A | Discharge status: Home / Self Care | Payer: PRIVATE HEALTH INSURANCE | Source: Ambulatory Visit | Attending: Internal Medicine | Admitting: Internal Medicine

## 2020-08-04 ENCOUNTER — Other Ambulatory Visit: Payer: Self-pay

## 2020-08-04 DIAGNOSIS — R072 Precordial pain: Secondary | ICD-10-CM | POA: Diagnosis present

## 2020-08-04 DIAGNOSIS — R931 Abnormal findings on diagnostic imaging of heart and coronary circulation: Secondary | ICD-10-CM

## 2020-08-04 DIAGNOSIS — I2510 Atherosclerotic heart disease of native coronary artery without angina pectoris: Secondary | ICD-10-CM | POA: Diagnosis not present

## 2020-08-04 MED ORDER — NITROGLYCERIN 0.4 MG SL SUBL
0.8000 mg | SUBLINGUAL_TABLET | Freq: Once | SUBLINGUAL | Status: AC
  Administered 2020-08-04: 0.8 mg via SUBLINGUAL

## 2020-08-04 MED ORDER — NITROGLYCERIN 0.4 MG SL SUBL
SUBLINGUAL_TABLET | SUBLINGUAL | Status: AC
  Filled 2020-08-04: qty 2

## 2020-08-04 MED ORDER — IOHEXOL 350 MG/ML SOLN
100.0000 mL | Freq: Once | INTRAVENOUS | Status: AC | PRN
  Administered 2020-08-04: 100 mL via INTRAVENOUS

## 2020-08-07 ENCOUNTER — Telehealth: Payer: Self-pay | Admitting: *Deleted

## 2020-08-07 MED ORDER — METOPROLOL SUCCINATE ER 25 MG PO TB24: 25.0000 mg | ORAL_TABLET | Freq: Every day | ORAL | 3 refills | Status: DC

## 2020-08-07 NOTE — Telephone Encounter (Signed)
pt aware of results  ?New script sent to the pharmacy  ?Follow up scheduled  ?

## 2020-08-07 NOTE — Telephone Encounter (Signed)
-----   Message from Lewayne Bunting, MD sent at 08/07/2020  7:15 AM EDT ----- Add toprol 25 mg daily; arrange fu appt with me or APP as previously outlined. Olga Millers  ----- Message ----- From: Interface, Rad Results In Sent: 08/04/2020   5:04 PM EDT To: Lewayne Bunting, MD

## 2020-08-10 NOTE — Progress Notes (Signed)
HPI: FU CAD and presyncope. Patient previously seen in [facility] by Jeralene Huff MD.  There was a question of atrial septal defect.  However transesophageal echocardiogram December 2019 showed normal LV function, mild right ventricular enlargement, no atrial septal defect or other congenital abnormalities and no valvular heart disease noted.  Recently admitted with episode altered consciousness and memory loss/presyncope; also with chest pain and question left facial droop.  Echocardiogram April 2022 showed normal LV function.  EEG was normal.  Brain MRI normal.  CTA of the head and neck showed no disease. Troponins and Ddimer were normal.  Monitor 6/22 showed sinus with rare PVC. 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 FFR showed significant stenosis in the distal LAD. Since last seen, he has dyspnea with more vigorous activities but not routine activities.  He also notes chest pressure with vigorous activities relieved with rest.  There is no orthopnea, PND, pedal edema or syncope.  Current Outpatient Medications  Medication Sig Dispense Refill   aspirin EC 81 MG EC tablet Take 1 tablet (81 mg total) by mouth daily. Swallow whole. 30 tablet 11   famotidine (PEPCID) 40 MG tablet Take 40 mg by mouth daily.     JANUMET 50-1000 MG tablet Take 1 tablet by mouth 2 (two) times daily with a meal.     lisinopril (ZESTRIL) 5 MG tablet Take 5 mg by mouth daily.     metoprolol succinate (TOPROL XL) 25 MG 24 hr tablet Take 1 tablet (25 mg total) by mouth daily. 90 tablet 3   rosuvastatin (CRESTOR) 40 MG tablet Take 40 mg by mouth daily.     tamsulosin (FLOMAX) 0.4 MG CAPS capsule Take 0.8 mg by mouth daily.     TRESIBA FLEXTOUCH 100 UNIT/ML FlexTouch Pen Inject 46 Units into the skin at bedtime.     No current facility-administered medications for this visit.     Past Medical  History:  Diagnosis Date   Diabetes mellitus (HCC)    Hyperlipidemia    Hypertension     Past Surgical History:  Procedure Laterality Date   No prior surgery      Social History   Socioeconomic History   Marital status: Married    Spouse name: Not on file   Number of children: 5   Years of education: Not on file   Highest education level: Not on file  Occupational History   Not on file  Tobacco Use   Smoking status: Never   Smokeless tobacco: Never  Substance and Sexual Activity   Alcohol use: Not Currently   Drug use: Not on file   Sexual activity: Not on file  Other Topics Concern   Not on file  Social History Narrative   Not on file   Social Determinants of Health   Financial Resource Strain: Not on file  Food Insecurity: Not on file  Transportation Needs: Not on file  Physical Activity: Not on file  Stress: Not on file  Social Connections: Not on file  Intimate Partner Violence: Not on file    Family History  Problem Relation Age of Onset   Liver cancer Mother    Heart attack Father     ROS: no fevers or chills, productive cough, hemoptysis, dysphasia, odynophagia, melena, hematochezia, dysuria, hematuria, rash, seizure activity, orthopnea, PND, pedal edema, claudication. Remaining systems are negative.  Physical Exam: Well-developed well-nourished in no acute distress.  Skin is warm and dry.  HEENT is normal.  Neck is supple.  Chest is clear to auscultation with normal expansion.  Cardiovascular exam is regular rate and rhythm.  Abdominal exam nontender or distended. No masses palpated. Extremities show no edema. neuro grossly intact  Electrocardiogram-sinus rhythm at a rate of 67 with no ST changes; personally reviewed.   A/P  1 CAD-continue ASA and statin.  2 chest pain-patient sounds to be having exertional angina and dyspnea.  Plan to continue medical therapy for his coronary artery disease found on CTA (appears to have moderate disease  in the proximal and mid LAD and severe disease in the distal LAD).  I will arrange for cardiac catheterization.  The risks and benefits including myocardial infarction, CVA and death discussed and he agrees to proceed.  Hold Janumet for 48 hours following catheterization.  3 hyperlipidemia-continue statin  4 presyncope-no recurrences; monitor unrevealing; will follow. Seen by neurology and felt to be vagal.  5 dilated pulmonary artery-suggestive of pulmonary hypertension.  Will include right heart catheterization at time of cardiac catheterization.  Patient does state he snores.  We will likely arrange sleep study in the future.  We will also likely need PFTs and additional evaluation once coronaries addressed.  Olga Millers, MD

## 2020-08-11 ENCOUNTER — Ambulatory Visit: Payer: PRIVATE HEALTH INSURANCE | Admitting: Cardiology

## 2020-08-11 ENCOUNTER — Encounter: Payer: Self-pay | Admitting: Cardiology

## 2020-08-11 ENCOUNTER — Other Ambulatory Visit: Payer: Self-pay

## 2020-08-11 VITALS — BP 124/72 | HR 67 | Ht 68.5 in | Wt 200.0 lb

## 2020-08-11 DIAGNOSIS — E7800 Pure hypercholesterolemia, unspecified: Secondary | ICD-10-CM | POA: Diagnosis not present

## 2020-08-11 DIAGNOSIS — R072 Precordial pain: Secondary | ICD-10-CM

## 2020-08-11 DIAGNOSIS — I2510 Atherosclerotic heart disease of native coronary artery without angina pectoris: Secondary | ICD-10-CM | POA: Diagnosis not present

## 2020-08-11 NOTE — Patient Instructions (Signed)
    [facility] CARDIOVASCULAR DIVISION [facility] NORTHLINE [address] [place] [address] Dept: [phone] Loc: [phone]  Sandip Power  08/11/2020  You are scheduled for a Cardiac Catheterization on Thursday, June 23 with Dr. Verne Carrow.  1. Please arrive at the [facility] (Main Entrance A) at [facility]: [address] [place], [place] [address] at 7:00 AM (This time is two hours before your procedure to ensure your preparation). Free valet parking service is available.   Special note: Every effort is made to have your procedure done on time. Please understand that emergencies sometimes delay scheduled procedures.  2. Diet: Do not eat solid foods after midnight.  The patient may have clear liquids until 5am upon the day of the procedure.   4. Medication instructions in preparation for your procedure:  DO NOT TAKE JANUMET 6/23, 6/24 or 6/25 restart 6/26  TAKE 1/2 DOSE OF INSULIN THE EVENING PRIOR TO THE PROCEDURE AND NONE THE MORNING OF THE PROCEDURE   On the morning of your procedure, take your Aspirin and Brilinta/Ticagrelor and any morning medicines NOT listed above.  You may use sips of water.  5. Plan for one night stay--bring personal belongings. 6. Bring a current list of your medications and current insurance cards. 7. You MUST have a responsible person to drive you home. 8. Someone MUST be with you the first 24 hours after you arrive home or your discharge will be delayed. 9. Please wear clothes that are easy to get on and off and wear slip-on shoes.  Thank you for allowing [place] to care for you!   -- [place] [facility]

## 2020-08-13 ENCOUNTER — Other Ambulatory Visit: Payer: Self-pay

## 2020-08-13 ENCOUNTER — Emergency Department (HOSPITAL_COMMUNITY): Payer: PRIVATE HEALTH INSURANCE

## 2020-08-13 ENCOUNTER — Encounter (HOSPITAL_COMMUNITY): Payer: Self-pay

## 2020-08-13 ENCOUNTER — Inpatient Hospital Stay (HOSPITAL_COMMUNITY)
Admission: EM | Admit: 2020-08-13 | Discharge: 2020-08-15 | DRG: 287 | Disposition: A | Discharge status: Home / Self Care | Payer: PRIVATE HEALTH INSURANCE | Attending: Cardiology | Admitting: Cardiology

## 2020-08-13 DIAGNOSIS — I10 Essential (primary) hypertension: Secondary | ICD-10-CM | POA: Diagnosis present

## 2020-08-13 DIAGNOSIS — I200 Unstable angina: Secondary | ICD-10-CM | POA: Diagnosis present

## 2020-08-13 DIAGNOSIS — I25110 Atherosclerotic heart disease of native coronary artery with unstable angina pectoris: Secondary | ICD-10-CM | POA: Diagnosis not present

## 2020-08-13 DIAGNOSIS — Z8249 Family history of ischemic heart disease and other diseases of the circulatory system: Secondary | ICD-10-CM

## 2020-08-13 DIAGNOSIS — Z79899 Other long term (current) drug therapy: Secondary | ICD-10-CM

## 2020-08-13 DIAGNOSIS — Z7982 Long term (current) use of aspirin: Secondary | ICD-10-CM

## 2020-08-13 DIAGNOSIS — R079 Chest pain, unspecified: Secondary | ICD-10-CM

## 2020-08-13 DIAGNOSIS — E1165 Type 2 diabetes mellitus with hyperglycemia: Secondary | ICD-10-CM | POA: Diagnosis present

## 2020-08-13 DIAGNOSIS — E785 Hyperlipidemia, unspecified: Secondary | ICD-10-CM | POA: Diagnosis present

## 2020-08-13 DIAGNOSIS — Z20822 Contact with and (suspected) exposure to covid-19: Secondary | ICD-10-CM | POA: Diagnosis present

## 2020-08-13 LAB — BASIC METABOLIC PANEL
Anion gap: 9 (ref 5–15)
BUN: 11 mg/dL (ref 6–20)
CO2: 26 mmol/L (ref 22–32)
Calcium: 9.9 mg/dL (ref 8.9–10.3)
Chloride: 101 mmol/L (ref 98–111)
Creatinine, Ser: 0.91 mg/dL (ref 0.61–1.24)
GFR, Estimated: >60 mL/min (ref >60)
Glucose, Bld: 174 mg/dL — ABNORMAL HIGH (ref 70–99)
Potassium: 4.2 mmol/L (ref 3.5–5.1)
Sodium: 136 mmol/L (ref 135–145)

## 2020-08-13 LAB — CBC
HCT: 44.8 % (ref 39.0–52.0)
Hemoglobin: 15.4 g/dL (ref 13.0–17.0)
MCH: 30.6 pg (ref 26.0–34.0)
MCHC: 34.4 g/dL (ref 30.0–36.0)
MCV: 89.1 fL (ref 80.0–100.0)
Platelets: 276 10*3/uL (ref 150–400)
RBC: 5.03 MIL/uL (ref 4.22–5.81)
RDW: 12.3 % (ref 11.5–15.5)
WBC: 11.1 10*3/uL — ABNORMAL HIGH (ref 4.0–10.5)
nRBC: 0 % (ref 0.0–0.2)

## 2020-08-13 LAB — TROPONIN I (HIGH SENSITIVITY): Troponin I (High Sensitivity): <2 ng/L (ref <18)

## 2020-08-13 MED ORDER — ASPIRIN 81 MG PO CHEW
324.0000 mg | CHEWABLE_TABLET | Freq: Once | ORAL | Status: AC
  Administered 2020-08-13: 324 mg via ORAL

## 2020-08-13 NOTE — ED Provider Notes (Signed)
Emergency Medicine Provider Triage Evaluation Note  Eric Bauer , a 55 y.o. male  was evaluated in triage.  Pt complains of left-sided chest pain with associated shortness of breath, nausea that started around 630 this evening.  Patient with history of exertional chest pain in the past, is scheduled for heart catheterization this coming week for further evaluation.  States his pain is exacerbated with exertion, associated shortness of breath, radiates to his left neck, and has associated nausea.  He denies any diaphoresis or syncope.  Review of Systems  Positive: Chest pain, shortness of breath, nausea Negative: Vomiting, cough, diaphoresis  Physical Exam  BP 130/86 (BP Location: Right Arm)   Pulse 81   Temp 98.1 F (36.7 C)   Resp 18   Ht 5' 8.5" (1.74 m)   Wt 90.7 kg   SpO2 99%   BMI 29.96 kg/m  Gen:   Awake, no distress   Resp:  Normal effort  MSK:   Moves extremities without difficulty  Other:  RRR, no M/R/G.  Lungs CTA B.  No lower extremity edema  Medical Decision Making  Medically screening exam initiated at 10:05 PM.  Appropriate orders placed.  Eric Bauer was informed that the remainder of the evaluation will be completed by another provider, this initial triage assessment does not replace that evaluation, and the importance of remaining in the ED until their evaluation is complete.  This chart was dictated using voice recognition software, Dragon. Despite the best efforts of this provider to proofread and correct errors, errors may still occur which can change documentation meaning.    Sherrilee Gilles 08/13/20 2207    Koleen Distance, MD 08/17/20 [phone]

## 2020-08-13 NOTE — ED Triage Notes (Signed)
Patient reports chest pain and shortness of breath, states he have a family dinner and started feeling short of breath, is scheduled for a cardiac cath on Thursday, 6/23. Central chest pain radiating into L neck

## 2020-08-14 ENCOUNTER — Other Ambulatory Visit: Payer: Self-pay | Admitting: *Deleted

## 2020-08-14 ENCOUNTER — Encounter (HOSPITAL_COMMUNITY): Payer: Self-pay | Admitting: Student in an Organized Health Care Education/Training Program

## 2020-08-14 DIAGNOSIS — I200 Unstable angina: Secondary | ICD-10-CM

## 2020-08-14 DIAGNOSIS — Z20822 Contact with and (suspected) exposure to covid-19: Secondary | ICD-10-CM | POA: Diagnosis present

## 2020-08-14 DIAGNOSIS — Z7982 Long term (current) use of aspirin: Secondary | ICD-10-CM | POA: Diagnosis not present

## 2020-08-14 DIAGNOSIS — Z8249 Family history of ischemic heart disease and other diseases of the circulatory system: Secondary | ICD-10-CM | POA: Diagnosis not present

## 2020-08-14 DIAGNOSIS — I25110 Atherosclerotic heart disease of native coronary artery with unstable angina pectoris: Secondary | ICD-10-CM | POA: Diagnosis present

## 2020-08-14 DIAGNOSIS — E119 Type 2 diabetes mellitus without complications: Secondary | ICD-10-CM

## 2020-08-14 DIAGNOSIS — E785 Hyperlipidemia, unspecified: Secondary | ICD-10-CM

## 2020-08-14 DIAGNOSIS — Z79899 Other long term (current) drug therapy: Secondary | ICD-10-CM | POA: Diagnosis not present

## 2020-08-14 DIAGNOSIS — E1165 Type 2 diabetes mellitus with hyperglycemia: Secondary | ICD-10-CM | POA: Diagnosis present

## 2020-08-14 DIAGNOSIS — I10 Essential (primary) hypertension: Secondary | ICD-10-CM

## 2020-08-14 LAB — HEPARIN LEVEL (UNFRACTIONATED): Heparin Unfractionated: <0.1 IU/mL — ABNORMAL LOW (ref 0.30–0.70)

## 2020-08-14 LAB — GLUCOSE, CAPILLARY
Glucose-Capillary: 181 mg/dL — ABNORMAL HIGH (ref 70–99)
Glucose-Capillary: 143 mg/dL — ABNORMAL HIGH (ref 70–99)

## 2020-08-14 LAB — RESP PANEL BY RT-PCR (FLU A&B, COVID) ARPGX2
Influenza A by PCR: NEGATIVE
Influenza B by PCR: NEGATIVE
SARS Coronavirus 2 by RT PCR: NEGATIVE

## 2020-08-14 LAB — CBG MONITORING, ED: Glucose-Capillary: 166 mg/dL — ABNORMAL HIGH (ref 70–99)

## 2020-08-14 LAB — TROPONIN I (HIGH SENSITIVITY): Troponin I (High Sensitivity): 4 ng/L (ref <18)

## 2020-08-14 MED ORDER — LISINOPRIL 5 MG PO TABS
5.0000 mg | ORAL_TABLET | Freq: Every day | ORAL | Status: DC
  Administered 2020-08-14: 5 mg via ORAL
  Filled 2020-08-14: qty 1

## 2020-08-14 MED ORDER — ASPIRIN EC 81 MG PO TBEC: 81.0000 mg | DELAYED_RELEASE_TABLET | Freq: Every day | ORAL | Status: DC

## 2020-08-14 MED ORDER — NITROGLYCERIN 0.4 MG SL SUBL: 0.4000 mg | SUBLINGUAL_TABLET | SUBLINGUAL | Status: DC | PRN

## 2020-08-14 MED ORDER — ROSUVASTATIN CALCIUM 20 MG PO TABS: 40.0000 mg | ORAL_TABLET | Freq: Every day | ORAL | Status: DC

## 2020-08-14 MED ORDER — SODIUM CHLORIDE 0.9% FLUSH: 3.0000 mL | INTRAVENOUS | Status: DC | PRN

## 2020-08-14 MED ORDER — ACETAMINOPHEN 325 MG PO TABS: 650.0000 mg | ORAL_TABLET | ORAL | Status: DC | PRN

## 2020-08-14 MED ORDER — SODIUM CHLORIDE 0.9 % WEIGHT BASED INFUSION
3.0000 mL/kg/h | INTRAVENOUS | Status: AC
  Administered 2020-08-14: 3 mL/kg/h via INTRAVENOUS

## 2020-08-14 MED ORDER — SODIUM CHLORIDE 0.9% FLUSH
3.0000 mL | Freq: Two times a day (BID) | INTRAVENOUS | Status: DC
  Administered 2020-08-14 – 2020-08-15 (×3): 3 mL via INTRAVENOUS

## 2020-08-14 MED ORDER — HEPARIN (PORCINE) 25000 UT/250ML-% IV SOLN
1000.0000 [IU]/h | INTRAVENOUS | Status: DC
  Administered 2020-08-14: 1000 [IU]/h via INTRAVENOUS
  Filled 2020-08-14: qty 250

## 2020-08-14 MED ORDER — HEPARIN BOLUS VIA INFUSION
3000.0000 [IU] | Freq: Once | INTRAVENOUS | Status: AC
  Administered 2020-08-14: 3000 [IU] via INTRAVENOUS
  Filled 2020-08-14: qty 3000

## 2020-08-14 MED ORDER — METOPROLOL SUCCINATE ER 25 MG PO TB24
25.0000 mg | ORAL_TABLET | Freq: Every day | ORAL | Status: DC
  Administered 2020-08-14: 25 mg via ORAL
  Filled 2020-08-14: qty 1

## 2020-08-14 MED ORDER — HEPARIN (PORCINE) 25000 UT/250ML-% IV SOLN
1600.0000 [IU]/h | INTRAVENOUS | Status: DC
  Administered 2020-08-14 (×2): 1400 [IU]/h via INTRAVENOUS
  Filled 2020-08-14: qty 250

## 2020-08-14 MED ORDER — ROSUVASTATIN CALCIUM 20 MG PO TABS
40.0000 mg | ORAL_TABLET | Freq: Every day | ORAL | Status: DC
  Administered 2020-08-14: 40 mg via ORAL
  Filled 2020-08-14: qty 2

## 2020-08-14 MED ORDER — INSULIN ASPART 100 UNIT/ML IJ SOLN
0.0000 [IU] | Freq: Every day | INTRAMUSCULAR | Status: DC
Start: 2020-08-14 — End: 2020-08-15

## 2020-08-14 MED ORDER — ONDANSETRON HCL 4 MG/2ML IJ SOLN: 4.0000 mg | Freq: Four times a day (QID) | INTRAMUSCULAR | Status: DC | PRN

## 2020-08-14 MED ORDER — ASPIRIN 81 MG PO CHEW
81.0000 mg | CHEWABLE_TABLET | ORAL | Status: AC
  Administered 2020-08-15: 81 mg via ORAL
  Filled 2020-08-14: qty 1

## 2020-08-14 MED ORDER — TAMSULOSIN HCL 0.4 MG PO CAPS
0.8000 mg | ORAL_CAPSULE | Freq: Every day | ORAL | Status: DC
  Administered 2020-08-14: 0.8 mg via ORAL
  Filled 2020-08-14: qty 2

## 2020-08-14 MED ORDER — INSULIN GLARGINE 100 UNIT/ML ~~LOC~~ SOLN
36.0000 [IU] | Freq: Every day | SUBCUTANEOUS | Status: DC
  Administered 2020-08-14: 36 [IU] via SUBCUTANEOUS
  Filled 2020-08-14 (×2): qty 0.36

## 2020-08-14 MED ORDER — HEPARIN BOLUS VIA INFUSION
4000.0000 [IU] | Freq: Once | INTRAVENOUS | Status: AC
  Administered 2020-08-14: 4000 [IU] via INTRAVENOUS
  Filled 2020-08-14: qty 4000

## 2020-08-14 MED ORDER — INSULIN ASPART 100 UNIT/ML IJ SOLN
0.0000 [IU] | Freq: Three times a day (TID) | INTRAMUSCULAR | Status: DC
  Administered 2020-08-14: 3 [IU] via SUBCUTANEOUS
  Administered 2020-08-14: 2 [IU] via SUBCUTANEOUS
  Administered 2020-08-14: 3 [IU] via SUBCUTANEOUS
  Administered 2020-08-15: 2 [IU] via SUBCUTANEOUS
  Administered 2020-08-15: 3 [IU] via SUBCUTANEOUS
  Administered 2020-08-15: 2 [IU] via SUBCUTANEOUS

## 2020-08-14 MED ORDER — FAMOTIDINE 20 MG PO TABS
40.0000 mg | ORAL_TABLET | Freq: Every day | ORAL | Status: DC
  Administered 2020-08-14: 40 mg via ORAL
  Filled 2020-08-14: qty 2

## 2020-08-14 MED ORDER — SODIUM CHLORIDE 0.9 % IV SOLN: 250.0000 mL | INTRAVENOUS | Status: DC | PRN

## 2020-08-14 MED ORDER — SODIUM CHLORIDE 0.9 % WEIGHT BASED INFUSION
1.0000 mL/kg/h | INTRAVENOUS | Status: DC
  Administered 2020-08-14 – 2020-08-15 (×2): 1 mL/kg/h via INTRAVENOUS

## 2020-08-14 NOTE — ED Provider Notes (Signed)
[facility] EMERGENCY DEPARTMENT Provider Note   CSN: [id number] Arrival date & time: 08/13/20  2130     History Chief Complaint  Patient presents with   Chest Pain   Shortness of Breath    Eric Bauer is a 55 y.o. male.  55 year old male with history of diabetes, hyperlipidemia, hypertension presents to the emergency department for evaluation of left-sided chest pain and shortness of breath.  Symptoms began at 1830 while he was standing in the kitchen.  Pain noted to radiate to his left neck.  Notes some associated nausea as well as some paresthesias in his right arm.  His symptoms have spontaneously improved.  Reports that he has been experiencing similar symptoms, but usually with exertion.  He has never had this chest pain at rest before.  No associated fever, diaphoresis, vomiting, syncope, cough.  On chart review, had coronary CT this month with Ca score 1693 and finding concerning for moderate stenosis in the proximal and mid LAD and severe stenosis in the distal LAD. Scheduled for cardiac catheterization on Thursday.  The history is provided by the patient. No language interpreter was used.  Chest Pain Associated symptoms: shortness of breath   Shortness of Breath Associated symptoms: chest pain       Past Medical History:  Diagnosis Date   Diabetes mellitus (HCC)    Hyperlipidemia    Hypertension     Patient Active Problem List   Diagnosis Date Noted   Transient neurologic deficit 06/18/2020   Chest pain 06/18/2020   Uncontrolled type 2 diabetes mellitus with hyperglycemia (HCC) 06/18/2020   Near syncope 06/18/2020   Total bilirubin, elevated 06/18/2020   Cerebral thrombosis with cerebral infarction 06/18/2020    Past Surgical History:  Procedure Laterality Date   No prior surgery         Family History  Problem Relation Age of Onset   Liver cancer Mother    Heart attack Father     Social History   Tobacco Use   Smoking  status: Never   Smokeless tobacco: Never  Substance Use Topics   Alcohol use: Not Currently    Home Medications Prior to Admission medications   Medication Sig Start Date End Date Taking? Authorizing Provider  aspirin EC 81 MG EC tablet Take 1 tablet (81 mg total) by mouth daily. Swallow whole. 06/20/20   Kathlen Mody, MD  famotidine (PEPCID) 40 MG tablet Take 40 mg by mouth daily. 06/09/20   [provider]  JANUMET 50-1000 MG tablet Take 1 tablet by mouth 2 (two) times daily with a meal. 05/26/20   [provider]  lisinopril (ZESTRIL) 5 MG tablet Take 5 mg by mouth daily. 06/09/20   [provider]  metoprolol succinate (TOPROL XL) 25 MG 24 hr tablet Take 1 tablet (25 mg total) by mouth daily. 08/07/20   Lewayne Bunting, MD  rosuvastatin (CRESTOR) 40 MG tablet Take 40 mg by mouth daily. 06/09/20   [provider]  tamsulosin (FLOMAX) 0.4 MG CAPS capsule Take 0.8 mg by mouth daily. 06/09/20   [provider]  TRESIBA FLEXTOUCH 100 UNIT/ML FlexTouch Pen Inject 46 Units into the skin at bedtime. 06/09/20   [provider]    Allergies    Patient has no known allergies.  Review of Systems   Review of Systems  Respiratory:  Positive for shortness of breath.   Cardiovascular:  Positive for chest pain.  Ten systems reviewed and are negative for acute change,  except as noted in the HPI.    Physical Exam Updated Vital Signs BP 130/86 (BP Location: Right Arm)   Pulse 81   Temp 98.1 F (36.7 C)   Resp 18   Ht 5' 8.5" (1.74 m)   Wt 90.7 kg   SpO2 99%   BMI 29.96 kg/m   Physical Exam Vitals and nursing note reviewed.  Constitutional:      General: He is not in acute distress.    Appearance: He is well-developed. He is not diaphoretic.     Comments: Patient calm and cooperative, in NAD  HENT:     Head: Normocephalic and atraumatic.  Eyes:     General: No scleral icterus.    Conjunctiva/sclera: Conjunctivae normal.   Cardiovascular:     Rate and Rhythm: Normal rate and regular rhythm.     Pulses: Normal pulses.  Pulmonary:     Effort: Pulmonary effort is normal. No respiratory distress.     Breath sounds: No wheezing.     Comments: Respirations even and unlabored. Musculoskeletal:        General: Normal range of motion.     Cervical back: Normal range of motion.     Comments: No BLE edema.  Skin:    General: Skin is warm and dry.     Coloration: Skin is not pale.     Findings: No erythema or rash.  Neurological:     Mental Status: He is alert and oriented to person, place, and time.  Psychiatric:        Behavior: Behavior normal.    ED Results / Procedures / Treatments   Labs (all labs ordered are listed, but only abnormal results are displayed) Labs Reviewed  BASIC METABOLIC PANEL - Abnormal; Notable for the following components:      Result Value   Glucose, Bld 174 (*)    All other components within normal limits  CBC - Abnormal; Notable for the following components:   WBC 11.1 (*)    All other components within normal limits  RESP PANEL BY RT-PCR (FLU A&B, COVID) [id number]  TROPONIN I (HIGH SENSITIVITY)  TROPONIN I (HIGH SENSITIVITY)    EKG EKG Interpretation  Date/Time:  Sunday August 13 2020 21:48:43 EDT Ventricular Rate:  88 PR Interval:  142 QRS Duration: 86 QT Interval:  338 QTC Calculation: 408 R Axis:   24 Text Interpretation: Normal sinus rhythm Cannot rule out Anterior infarct , age undetermined Abnormal ECG Confirmed by Zadie Rhine ([id number]) on 08/14/2020 2:04:56 AM  Radiology DG Chest 2 View  Result Date: 08/13/2020 CLINICAL DATA:  Chest pain and shortness of breath EXAM: CHEST - 2 VIEW COMPARISON:  06/17/2020 FINDINGS: The heart size and mediastinal contours are within normal limits. Both lungs are clear. The visualized skeletal structures are unremarkable. IMPRESSION: No active cardiopulmonary disease. Electronically Signed   By: Alcide Clever M.D.   On:  08/13/2020 22:46    Procedures Procedures   Medications Ordered in ED Medications  aspirin chewable tablet 324 mg (324 mg Oral Given 08/13/20 2206)    ED Course  I have reviewed the triage vital signs and the nursing notes.  Pertinent labs & imaging results that were available during my care of the patient were reviewed by me and considered in my medical decision making (see chart for details).  Clinical Course as of 08/14/20 0238  Mon Aug 14, 2020  0623 Page placed to cardiology to discuss disposition. [KH]  0235 Repeat paged placed to Cardiology [  KH]  [phone] Spoke with Dr. Early Osmond of cardiology. Agrees with admission for more urgent catheterization. He will assess in the ED with plan for admission to the Cardiology service. [KH]    Clinical Course User Index [KH] Darylene Price   MDM Rules/Calculators/A&P                          55 year old male presents to the emergency department for evaluation of left-sided chest pain with shortness of breath, nausea.  Pain radiating to the left neck.  He has had similar symptoms in the past brought on by exertion; however, his symptoms today began while at rest.  He was standing in the kitchen.  Notes they have spontaneously improved.  His cardiac work-up in the ED is reassuring; however, he has history of concerning coronary CTA earlier this month.  Was planned to receive a cardiac catheterization in 3 days.  Feel it is reasonable for him to be admitted for more urgent catheterization.  Case was discussed with cardiology fellow who will assess the patient in the ED for admission to their service.   Final Clinical Impression(s) / ED Diagnoses Final diagnoses:  Chest pain, unspecified type    Rx / DC Orders ED Discharge Orders     None        Antony Madura, PA-C 08/14/20 0240    Zadie Rhine, MD 08/14/20 [phone]

## 2020-08-14 NOTE — Progress Notes (Signed)
ANTICOAGULATION CONSULT NOTE - Follow Up Consult  Pharmacy Consult for heparin Indication:  USAP   Labs: Recent Labs    08/13/20 2211 08/14/20 0001 08/14/20 1604  HGB 15.4  --   --   HCT 44.8  --   --   PLT 276  --   --   HEPARINUNFRC  --   --  <0.10*  CREATININE 0.91  --   --   TROPONINIHS <2 4  --     Assessment: 55yo male subtherapeutic on heparin with initial dosing for USAP; no gtt issues or signs of bleeding per RN.  Goal of Therapy:  Heparin level 0.3-0.7 units/ml   Plan:  Will rebolus with heparin 3000 units and increase heparin gtt by 4 units/kg/hr to 1400 units/hr and check level in 6 hours.    Vernard Gambles, PharmD, BCPS  08/14/2020,5:55 PM

## 2020-08-14 NOTE — Progress Notes (Addendum)
    Pt admitted this morning with progressive angina. Originally scheduled for cath 6/23 after abnormal CCTA, also dilated PA. No chest pain currently. Remains on IV heparin. On the schedule for R/L cath today. Orders placed, has been NPO.   Signed, Starlet Gallentine, NP-C 08/14/2020, 7:42 AM Pager: [phone]  

## 2020-08-14 NOTE — Progress Notes (Signed)
ANTICOAGULATION CONSULT NOTE - Initial Consult  Pharmacy Consult for Heparin  Indication: chest pain/ACS  No Known Allergies  Patient Measurements: Height: 5' 8.5" (174 cm) Weight: 90.7 kg (199 lb 15.3 oz) IBW/kg (Calculated) : 69.55  Vital Signs: Temp: 98.1 F (36.7 C) (06/19 2146) BP: 130/86 (06/19 2146) Pulse Rate: 81 (06/19 2146)  Labs: Recent Labs    08/13/20 2211 08/14/20 0001  HGB 15.4  --   HCT 44.8  --   PLT 276  --   CREATININE 0.91  --   TROPONINIHS <2 4    Estimated Creatinine Clearance: 103.6 mL/min (by C-G formula based on SCr of 0.91 mg/dL).   Medical History: Past Medical History:  Diagnosis Date   Diabetes mellitus (HCC)    Hyperlipidemia    Hypertension     Assessment: 55 y/o M with chest pain. Starting heparin per pharmacy. CBC/renal function good. PTA meds reviewed.   Goal of Therapy:  Heparin level 0.3-0.7 units/ml Monitor platelets by anticoagulation protocol: Yes   Plan:  Heparin 4000 units BOLUS Start heparin drip at 1000 units/hr 1500 Heparin level Daily CBC/Heparin level Monitor for bleeding  Abran Duke, PharmD, BCPS Clinical Pharmacist Phone: [phone]

## 2020-08-14 NOTE — H&P (Signed)
Cardiology Admission History and Physical:   Patient ID: Eric Bauer MRN: [id number]; DOB: [birth year]   Admission date: 08/13/2020  Primary Care Provider: Ranae PilaEe, Lena, FNP [facility] Cardiologist: Olga Millersrenshaw, Brian, MD  [facility] Electrophysiologist:  None   Chief Complaint: chest pain/SOB  Patient Profile:   Eric Bauer is a 55 y.o. male with DM2, HLD, HTN and CAD who presents with chest pain/SOB that is now occurring at rest.   History of Present Illness:   Eric Bauer is followed by Dr. Jens Somrenshaw and was recently found to have an elevated CAC of 1693 with moderate stenosis to the proximal/mid LAD and severe stenosis in the distal LAD that was FFR positive.  During clinic follow-up he was only having dyspnea with exertional activities however he has noted that the symptoms have become more frequent and progressive over the past few months.  He had no similar symptoms prior to April 2022 when he had a syncopal event at home with his family.   In April he had a presyncope/syncope event while at home with his family and was admitted with altered consciousness and memory loss along with chest pain and a questionable left facial droop.  TTE during that time showed a normal LVEF.  CTA of the head and neck along with brain MRI were unremarkable.  EEG was also normal at that time.  Lab work including a troponin and D-dimer within normal limits.  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  This past week he had symptoms with walking around doing minimal physical activity at work.  He was plan for coronary evaluation this Thursday however on Saturday he was out with his sister and had an acute onset episode of shortness of breath while they were at lunch followed by severe right upper extremity, back, and chest discomfort.  This lasted for several  hours before eventually going away.  He had similar recurrent symptoms on Sunday when he was at home with his family and sitting down doing nothing.  The pain again was fairly severe and occurred at rest.  His family at that time strongly encouraged him to seek further evaluation in the ED.  During most these episodes he also has accompanying nausea.  He denies any orthopnea, PND, weight gain, or lower extremity edema.  Past Medical History:  Diagnosis Date   Diabetes mellitus (HCC)    Hyperlipidemia    Hypertension    Past Surgical History:  Procedure Laterality Date   No prior surgery      Medications Prior to Admission: Prior to Admission medications   Medication Sig Start Date End Date Taking? Authorizing Provider  aspirin EC 81 MG EC tablet Take 1 tablet (81 mg total) by mouth daily. Swallow whole. 06/20/20  Yes Kathlen ModyAkula, Vijaya, MD  famotidine (PEPCID) 40 MG tablet Take 40 mg by mouth daily. 06/09/20  Yes [provider]  JANUMET 50-1000 MG tablet Take 1 tablet by mouth 2 (two) times daily with a meal. 05/26/20  Yes [provider]  lisinopril (ZESTRIL) 5 MG tablet Take 5 mg by mouth daily. 06/09/20  Yes [provider]  metoprolol succinate (TOPROL XL) 25 MG 24 hr tablet Take 1 tablet (25 mg total) by mouth daily. 08/07/20  Yes Lewayne Buntingrenshaw, Brian S, MD  rosuvastatin (CRESTOR) 40 MG tablet Take 40 mg by mouth daily. 06/09/20  Yes  [provider]  tamsulosin (FLOMAX) 0.4 MG CAPS capsule Take 0.8 mg by mouth daily. 06/09/20  Yes [provider]  TRESIBA FLEXTOUCH 100 UNIT/ML FlexTouch Pen Inject 46 Units into the skin at bedtime. 06/09/20  Yes [provider]    Allergies:   No Known Allergies  Social History:   Social History   Socioeconomic History   Marital status: Married    Spouse name: Not on file   Number of children: 5   Years of education: Not on file   Highest education level: Not on file  Occupational History   Not on file   Tobacco Use   Smoking status: Never   Smokeless tobacco: Never  Substance and Sexual Activity   Alcohol use: Not Currently   Drug use: Not on file   Sexual activity: Not on file  Other Topics Concern   Not on file  Social History Narrative   Not on file   Social Determinants of Health   Financial Resource Strain: Not on file  Food Insecurity: Not on file  Transportation Needs: Not on file  Physical Activity: Not on file  Stress: Not on file  Social Connections: Not on file  Intimate Partner Violence: Not on file    Family History:   The patient's family history includes Heart attack in his father; Liver cancer in his mother.    ROS:   Review of Systems: [y] = yes, [ ]  = no      General: Weight gain [ ] ; Weight loss [ ] ; Anorexia [ ] ; Fatigue [ ] ; Fever [ ] ; Chills [ ] ; Weakness [ ]    Cardiac: Chest pain/pressure [y]; Resting SOB [y]; Exertional SOB [y]; Orthopnea [ ] ; Pedal Edema [ ] ; Palpitations [ ] ; Syncope [ ] ; Presyncope [ ] ; Paroxysmal nocturnal dyspnea [ ]    Pulmonary: Cough [ ] ; Wheezing [ ] ; Hemoptysis [ ] ; Sputum [ ] ; Snoring [ ]    GI: Vomiting [ ] ; Dysphagia [ ] ; Melena [ ] ; Hematochezia [ ] ; Heartburn [ ] ; Abdominal pain [ ] ; Constipation [ ] ; Diarrhea [ ] ; BRBPR [ ]    GU: Hematuria [ ] ; Dysuria [ ] ; Nocturia [ ]  Vascular: Pain in legs with walking [ ] ; Pain in feet with lying flat [ ] ; Non-healing sores [ ] ; Stroke [ ] ; TIA [ ] ; Slurred speech [ ] ;   Neuro: Headaches [ ] ; Vertigo [ ] ; Seizures [ ] ; Paresthesias [ ] ;Blurred vision [ ] ; Diplopia [ ] ; Vision changes [ ]    Ortho/Skin: Arthritis [ ] ; Joint pain [ ] ; Muscle pain [ ] ; Joint swelling [ ] ; Back Pain [ ] ; Rash [ ]    Psych: Depression [ ] ; Anxiety [ ]    Heme: Bleeding problems [ ] ; Clotting disorders [ ] ; Anemia [ ]    Endocrine: Diabetes [ ] ; Thyroid dysfunction [ ]    Physical Exam/Data:   Vitals:   08/13/20 2146 08/13/20 2200  BP: 130/86   Pulse: 81   Resp: 18   Temp: 98.1 F (36.7 C)   SpO2:  99%   Weight:  90.7 kg  Height:  5' 8.5" (1.74 m)   No intake or output data in the 24 hours ending 08/14/20 0552 Last 3 Weights 08/13/2020 08/11/2020 06/21/2020  Weight (lbs) 199 lb 15.3 oz 200 lb 190 lb 6.4 oz  Weight (kg) 90.7 kg 90.719 kg 86.365 kg     Body mass index is 29.96 kg/m.  General:  Well nourished, well developed, in no acute distress HEENT:  normal Lymph: no adenopathy Neck: no JVD Endocrine:  No thryomegaly Vascular: No carotid bruits; FA pulses 2+ bilaterally without bruits  Cardiac:  normal S1, S2; RRR; no murmur  Lungs:  clear to auscultation bilaterally, no wheezing, rhonchi or rales  Abd: soft, nontender, no hepatomegaly  Ext: no LE edema Musculoskeletal:  No deformities, BUE and BLE strength normal and equal Skin: warm and dry  Neuro:  CNs 2-12 intact, no focal abnormalities noted Psych:  Normal affect   EKG:  The ECG that was done showed NSR (88) without ischemic changes   Laboratory Data:  High Sensitivity Troponin:   Recent Labs  Lab 08/13/20 2211 08/14/20 0001  TROPONINIHS <2 4      Chemistry Recent Labs  Lab 08/13/20 2211  NA 136  K 4.2  CL 101  CO2 26  GLUCOSE 174*  BUN 11  CREATININE 0.91  CALCIUM 9.9  GFRNONAA >60  ANIONGAP 9    No results for input(s): PROT, ALBUMIN, AST, ALT, ALKPHOS, BILITOT in the last 168 hours. Hematology Recent Labs  Lab 08/13/20 2211  WBC 11.1*  RBC 5.03  HGB 15.4  HCT 44.8  MCV 89.1  MCH 30.6  MCHC 34.4  RDW 12.3  PLT 276   BNPNo results for input(s): BNP, PROBNP in the last 168 hours.  DDimer No results for input(s): DDIMER in the last 168 hours.  Radiology/Studies:  DG Chest 2 View  Result Date: 08/13/2020 CLINICAL DATA:  Chest pain and shortness of breath EXAM: CHEST - 2 VIEW COMPARISON:  06/17/2020 FINDINGS: The heart size and mediastinal contours are within normal limits. Both lungs are clear. The visualized skeletal structures are unremarkable. IMPRESSION: No active cardiopulmonary  disease. Electronically Signed   By: Alcide Clever M.D.   On: 08/13/2020 22:46                                                                                                                                                                            cCTA Result date: 08/04/20 1. Moderate-severe CAD in the proximal, mid and distal LAD, CADRADS = 4. CT FFR will be performed and reported separately. 2. Coronary calcium score is 1693, which places the patient in the 99th percentile for age and sex matched control. 3. Normal coronary origin with right dominance. 4. Severe dilation of main pulmonary artery, 40 mm, suggestive of pulmonary hypertension. 5. Borderline dilation of ascending aorta, 38 mm at mid ascending aorta.  TTE Result date: 06/18/20  1. Left ventricular ejection fraction, by estimation, is 60 to 65%. The  left ventricle has normal function. The left ventricle has no regional  wall motion abnormalities. Left ventricular diastolic parameters were  normal.   2. Right  ventricular systolic function is normal. The right ventricular  size is normal. Tricuspid regurgitation signal is inadequate for assessing  PA pressure.   3. The mitral valve is normal in structure. Trivial mitral valve  regurgitation. No evidence of mitral stenosis.   4. The aortic valve is tricuspid. Aortic valve regurgitation is not  visualized. No aortic stenosis is present.   5. The inferior vena cava is normal in size with greater than 50%  respiratory variability, suggesting right atrial pressure of 3 mmHg.   TIMI Risk Score for Unstable Angina or Non-ST Elevation MI:   The patient's TIMI risk score is 4, which indicates a 20% risk of all cause mortality, new or recurrent myocardial infarction or need for urgent revascularization in the next 14 days.{  Assessment and Plan:   CAD Unstable angina Mr. Ta presented with symptoms that were concerning for unstable angina over the weekend with rest  shortness of breath with accompanying nausea and chest discomfort.  He also had new symptoms of right arm pain that were during the same time of his SOB/CP.  Given his recent coronary CT results with accompanying symptoms of think it is reasonable to proceed with coronary angiography today if we have availability.  He likely needs PCI to his distal LAD however if the more proximal portion has FFR positive review would benefit from CABG given his longstanding diabetes. - heparin gtt - s/p ASA 324 mg PO load - continue ASA 81 mg PO qhs (works third shift so all daily meds are nightly) - continue crestor - check Lpa given significant CAD in young patient, multiple family members with CAD although all were in the 81s  - NPO for possible coronary angio+RHC (dilated PA) today pending availability   HTN - continue lisinopril 5 mg PO daily   DM2 - hold home Janumet and [name] - takes [place] 46 units at 20:00, LD 06/19 PM  - start glargine 36 units qhs for basal tonight at 20:00  HLD - continue crestor 40 mg PO qhs - check Lpa  Severity of Illness: The appropriate patient status for this patient is INPATIENT. Inpatient status is judged to be reasonable and necessary in order to provide the required intensity of service to ensure the patient's safety. The patient's presenting symptoms, physical exam findings, and initial radiographic and laboratory data in the context of their chronic comorbidities is felt to place them at high risk for further clinical deterioration. Furthermore, it is not anticipated that the patient will be medically stable for discharge from the hospital within 2 midnights of admission. The following factors support the patient status of inpatient.   " The patient's presenting symptoms include chest pain/SOB. " The worrisome physical exam findings include none.  " The initial radiographic and laboratory data are worrisome because of none.  " The chronic co-morbidities include  CAD, HTN, HLD, DM2  * I certify that at the point of admission it is my clinical judgment that the patient will require inpatient hospital care spanning beyond 2 midnights from the point of admission due to high intensity of service, high risk for further deterioration and high frequency of surveillance required.*   For questions or updates, please contact [facility] Please consult [email or web address] for contact info under   Signed, Linton Rump, MD  08/14/2020 5:52 AM

## 2020-08-14 NOTE — H&P (View-Only) (Signed)
    Pt admitted this morning with progressive angina. Originally scheduled for cath 6/23 after abnormal CCTA, also dilated PA. No chest pain currently. Remains on IV heparin. On the schedule for R/L cath today. Orders placed, has been NPO.   Janice Coffin, NP-C 08/14/2020, 7:42 AM Pager: [phone]

## 2020-08-14 NOTE — ED Notes (Signed)
Attempted report x1. 

## 2020-08-15 ENCOUNTER — Encounter (HOSPITAL_COMMUNITY)
Admission: EM | Disposition: A | Discharge status: Home / Self Care | Payer: Self-pay | Source: Home / Self Care | Attending: Cardiology

## 2020-08-15 ENCOUNTER — Encounter (HOSPITAL_COMMUNITY): Payer: Self-pay | Admitting: Cardiovascular Disease

## 2020-08-15 DIAGNOSIS — I25110 Atherosclerotic heart disease of native coronary artery with unstable angina pectoris: Principal | ICD-10-CM

## 2020-08-15 DIAGNOSIS — E785 Hyperlipidemia, unspecified: Secondary | ICD-10-CM

## 2020-08-15 DIAGNOSIS — I200 Unstable angina: Secondary | ICD-10-CM

## 2020-08-15 HISTORY — PX: RIGHT HEART CATH AND CORONARY ANGIOGRAPHY: CATH118264

## 2020-08-15 LAB — POCT I-STAT 7, (LYTES, BLD GAS, ICA,H+H)
Acid-base deficit: 1 mmol/L (ref 0.0–2.0)
Bicarbonate: 22.9 mmol/L (ref 20.0–28.0)
Calcium, Ion: 1.26 mmol/L (ref 1.15–1.40)
HCT: 41 % (ref 39.0–52.0)
Hemoglobin: 13.9 g/dL (ref 13.0–17.0)
O2 Saturation: 97 %
Potassium: 3.9 mmol/L (ref 3.5–5.1)
Sodium: 141 mmol/L (ref 135–145)
TCO2: 24 mmol/L (ref 22–32)
pCO2 arterial: 36.9 mmHg (ref 32.0–48.0)
pH, Arterial: 7.401 (ref 7.350–7.450)
pO2, Arterial: 91 mmHg (ref 83.0–108.0)

## 2020-08-15 LAB — BASIC METABOLIC PANEL
Anion gap: 8 (ref 5–15)
BUN: 12 mg/dL (ref 6–20)
CO2: 24 mmol/L (ref 22–32)
Calcium: 9.4 mg/dL (ref 8.9–10.3)
Chloride: 104 mmol/L (ref 98–111)
Creatinine, Ser: 0.76 mg/dL (ref 0.61–1.24)
GFR, Estimated: >60 mL/min (ref >60)
Glucose, Bld: 184 mg/dL — ABNORMAL HIGH (ref 70–99)
Potassium: 3.8 mmol/L (ref 3.5–5.1)
Sodium: 136 mmol/L (ref 135–145)

## 2020-08-15 LAB — POCT I-STAT EG7
Acid-Base Excess: 0 mmol/L (ref 0.0–2.0)
Acid-base deficit: 1 mmol/L (ref 0.0–2.0)
Bicarbonate: 23.9 mmol/L (ref 20.0–28.0)
Bicarbonate: 24.7 mmol/L (ref 20.0–28.0)
Calcium, Ion: 1.25 mmol/L (ref 1.15–1.40)
Calcium, Ion: 1.3 mmol/L (ref 1.15–1.40)
HCT: 41 % (ref 39.0–52.0)
HCT: 42 % (ref 39.0–52.0)
Hemoglobin: 13.9 g/dL (ref 13.0–17.0)
Hemoglobin: 14.3 g/dL (ref 13.0–17.0)
O2 Saturation: 77 %
O2 Saturation: 77 %
Potassium: 4 mmol/L (ref 3.5–5.1)
Potassium: 4 mmol/L (ref 3.5–5.1)
Sodium: 140 mmol/L (ref 135–145)
Sodium: 141 mmol/L (ref 135–145)
TCO2: 25 mmol/L (ref 22–32)
TCO2: 26 mmol/L (ref 22–32)
pCO2, Ven: 40.3 mmHg — ABNORMAL LOW (ref 44.0–60.0)
pCO2, Ven: 41.5 mmHg — ABNORMAL LOW (ref 44.0–60.0)
pH, Ven: 7.381 (ref 7.250–7.430)
pH, Ven: 7.382 (ref 7.250–7.430)
pO2, Ven: 42 mmHg (ref 32.0–45.0)
pO2, Ven: 43 mmHg (ref 32.0–45.0)

## 2020-08-15 LAB — GLUCOSE, CAPILLARY
Glucose-Capillary: 136 mg/dL — ABNORMAL HIGH (ref 70–99)
Glucose-Capillary: 132 mg/dL — ABNORMAL HIGH (ref 70–99)
Glucose-Capillary: 172 mg/dL — ABNORMAL HIGH (ref 70–99)

## 2020-08-15 LAB — CBC
HCT: 42.4 % (ref 39.0–52.0)
Hemoglobin: 14.5 g/dL (ref 13.0–17.0)
MCH: 30.4 pg (ref 26.0–34.0)
MCHC: 34.2 g/dL (ref 30.0–36.0)
MCV: 88.9 fL (ref 80.0–100.0)
Platelets: 250 10*3/uL (ref 150–400)
RBC: 4.77 MIL/uL (ref 4.22–5.81)
RDW: 12.2 % (ref 11.5–15.5)
WBC: 10.6 10*3/uL — ABNORMAL HIGH (ref 4.0–10.5)
nRBC: 0 % (ref 0.0–0.2)

## 2020-08-15 LAB — LIPOPROTEIN A (LPA): Lipoprotein (a): 29.9 nmol/L (ref <75.0)

## 2020-08-15 LAB — HEPARIN LEVEL (UNFRACTIONATED): Heparin Unfractionated: 0.23 IU/mL — ABNORMAL LOW (ref 0.30–0.70)

## 2020-08-15 SURGERY — RIGHT HEART CATH AND CORONARY ANGIOGRAPHY
Anesthesia: LOCAL

## 2020-08-15 MED ORDER — HEPARIN (PORCINE) IN NACL 1000-0.9 UT/500ML-% IV SOLN
INTRAVENOUS | Status: DC | PRN
  Administered 2020-08-15: 500 mL

## 2020-08-15 MED ORDER — SODIUM CHLORIDE 0.9% FLUSH: 3.0000 mL | INTRAVENOUS | Status: DC | PRN

## 2020-08-15 MED ORDER — LIDOCAINE HCL (PF) 1 % IJ SOLN
INTRAMUSCULAR | Status: DC | PRN
  Administered 2020-08-15: 1 mL
  Administered 2020-08-15: 2 mL

## 2020-08-15 MED ORDER — HEPARIN (PORCINE) IN NACL 1000-0.9 UT/500ML-% IV SOLN
INTRAVENOUS | Status: AC
  Filled 2020-08-15: qty 1000

## 2020-08-15 MED ORDER — LABETALOL HCL 5 MG/ML IV SOLN: 10.0000 mg | INTRAVENOUS | Status: AC | PRN

## 2020-08-15 MED ORDER — IOHEXOL 350 MG/ML SOLN
INTRAVENOUS | Status: DC | PRN
  Administered 2020-08-15: 110 mL

## 2020-08-15 MED ORDER — SODIUM CHLORIDE 0.9 % IV SOLN: INTRAVENOUS | Status: DC

## 2020-08-15 MED ORDER — LIDOCAINE HCL (PF) 1 % IJ SOLN
INTRAMUSCULAR | Status: AC
  Filled 2020-08-15: qty 30

## 2020-08-15 MED ORDER — NITROGLYCERIN 1 MG/10 ML FOR IR/CATH LAB
INTRA_ARTERIAL | Status: AC
  Filled 2020-08-15: qty 10

## 2020-08-15 MED ORDER — HEPARIN SODIUM (PORCINE) 1000 UNIT/ML IJ SOLN
INTRAMUSCULAR | Status: AC
  Filled 2020-08-15: qty 1

## 2020-08-15 MED ORDER — SODIUM CHLORIDE 0.9% FLUSH: 3.0000 mL | Freq: Two times a day (BID) | INTRAVENOUS | Status: DC

## 2020-08-15 MED ORDER — MORPHINE SULFATE (PF) 2 MG/ML IV SOLN: 2.0000 mg | INTRAVENOUS | Status: DC | PRN

## 2020-08-15 MED ORDER — VERAPAMIL HCL 2.5 MG/ML IV SOLN
INTRAVENOUS | Status: AC
  Filled 2020-08-15: qty 2

## 2020-08-15 MED ORDER — SODIUM CHLORIDE 0.9 % IV SOLN: 250.0000 mL | INTRAVENOUS | Status: DC | PRN

## 2020-08-15 MED ORDER — ASPIRIN 81 MG PO CHEW: 81.0000 mg | CHEWABLE_TABLET | Freq: Every day | ORAL | Status: DC

## 2020-08-15 MED ORDER — HEPARIN SODIUM (PORCINE) 1000 UNIT/ML IJ SOLN
INTRAMUSCULAR | Status: DC | PRN
  Administered 2020-08-15: 4500 [IU] via INTRAVENOUS

## 2020-08-15 MED ORDER — NITROGLYCERIN 1 MG/10 ML FOR IR/CATH LAB
INTRA_ARTERIAL | Status: DC | PRN
  Administered 2020-08-15 (×2): 5 mL via INTRA_ARTERIAL

## 2020-08-15 MED ORDER — HYDRALAZINE HCL 20 MG/ML IJ SOLN: 10.0000 mg | INTRAMUSCULAR | Status: AC | PRN

## 2020-08-15 MED ORDER — ATORVASTATIN CALCIUM 80 MG PO TABS
80.0000 mg | ORAL_TABLET | Freq: Every day | ORAL | Status: DC
  Administered 2020-08-15: 80 mg via ORAL
  Filled 2020-08-15: qty 1

## 2020-08-15 MED ORDER — ONDANSETRON HCL 4 MG/2ML IJ SOLN: 4.0000 mg | Freq: Four times a day (QID) | INTRAMUSCULAR | Status: DC | PRN

## 2020-08-15 MED ORDER — ACETAMINOPHEN 325 MG PO TABS: 650.0000 mg | ORAL_TABLET | ORAL | Status: DC | PRN

## 2020-08-15 SURGICAL SUPPLY — 14 items
CATH 5FR JL3.5 JR4 ANG PIG MP (CATHETERS) ×1 IMPLANT
CATH BALLN WEDGE 5F 110CM (CATHETERS) ×1 IMPLANT
CATH OPTITORQUE TIG 4.0 5F (CATHETERS) ×1 IMPLANT
CATH VISTA GUIDE 6FR XB3 (CATHETERS) ×1 IMPLANT
DEVICE RAD COMP TR BAND LRG (VASCULAR PRODUCTS) ×1 IMPLANT
GLIDESHEATH SLEND A-KIT 6F 22G (SHEATH) ×1 IMPLANT
GUIDEWIRE INQWIRE 1.5J.035X260 (WIRE) IMPLANT
INQWIRE 1.5J .035X260CM (WIRE) ×2
KIT HEART LEFT (KITS) ×2 IMPLANT
PACK CARDIAC CATHETERIZATION (CUSTOM PROCEDURE TRAY) ×2 IMPLANT
SHEATH GLIDE SLENDER 4/5FR (SHEATH) ×1 IMPLANT
TRANSDUCER W/STOPCOCK (MISCELLANEOUS) ×2 IMPLANT
TUBING CIL FLEX 10 FLL-RA (TUBING) ×2 IMPLANT
WIRE HI TORQ VERSACORE-J 145CM (WIRE) ×1 IMPLANT

## 2020-08-15 NOTE — Discharge Summary (Addendum)
Discharge Summary    Patient ID: Eric Bauer MRN: [id number]; DOB: 1965/09/18  Admit date: 08/13/2020 Discharge date: 08/15/2020  PCP:  Ranae Pila, FNP   [facility] Providers Cardiologist:  Olga Millers, MD   Discharge Diagnoses    Principal Problem:   Unstable angina [facility]) Active Problems:   Uncontrolled type 2 diabetes mellitus with hyperglycemia (HCC)   Hyperlipidemia    Diagnostic Studies/Procedures    Cath: 08/15/20  Prox LAD lesion 40% stenosed _____________   History of Present Illness     Eric Bauer is a 55 y.o. male with  DM2, HLD, HTN and CAD who presents with chest pain/SOB that is now occurring at rest. Eric Bauer was referred  to cardiology and seen by Dr. Jens Som and was recently found to have an elevated CAC of 1693 with moderate stenosis to the proximal/mid LAD and severe stenosis in the distal LAD that was FFR positive.  During clinic follow-up he was only having dyspnea with exertional activities however he has noted that the symptoms have become more frequent and progressive over the past few months.  He had no similar symptoms prior to April 2022 when he had a syncopal event at home with his family.   In April he had a presyncope/syncope event while at home with his family and was admitted with altered consciousness and memory loss along with chest pain and a questionable left facial droop.  TTE during that time showed a normal LVEF.  CTA of the head and neck along with brain MRI were unremarkable.  EEG was also normal at that time.  Lab work including a troponin and D-dimer within normal limits.   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  The past week prior to admission, he had symptoms with walking around doing minimal physical activity at work.  He was plan for coronary evaluation the Thursday of week  of admission however on the Saturday prior he was out with his sister and had an acute onset episode of shortness of breath while they were at lunch followed by severe right upper extremity, back, and chest discomfort.  This lasted for several hours before eventually going away.  He had similar recurrent symptoms on Sunday when he was at home with his family and sitting down doing nothing.  The pain again was fairly severe and occurred at rest.  His family at that time strongly encouraged him to seek further evaluation in the ED.  During most these episodes he also has accompanying nausea.  He denied any orthopnea, PND, weight gain, or lower extremity edema. He was admitted to cardiology with plans for cardiac cath.   Hospital Course     Chest pain: Mr. Trulson presented with symptoms that were concerning for possible unstable angina . Given recent abnormal coronary CT with potential lesion in the LAD he underwent cardiac cath noted above. Only 40% LAD lesion noted, therefore felt CT overcalled disease. Recommendations for risk factor modification and medical management. -- ASA, statin, BB and ACEI   HTN: continue lisinopril 5 mg PO daily and Toprol 25mg  daily   DM2: Hgb A1c 8.2 (08/03/20) -- resumed on home Janumet and Tresiba 46 units qhs   HLD: continue crestor 40mg  daily -- Lpa 29  General: Well developed, well nourished, male appearing in no acute distress. Head: Normocephalic, atraumatic.  Neck: Supple without  bruits, JVD. Lungs:  Resp regular and unlabored, CTA. Heart: RRR, S1, S2, no S3, S4, or murmur; no rub. Abdomen: Soft, non-tender, non-distended with normoactive bowel sounds. No hepatomegaly. No rebound/guarding. No obvious abdominal masses. Extremities: No clubbing, cyanosis, edema. Distal pedal pulses are 2+ bilaterally. Right radial cath site stable without bruising or hematoma Neuro: Alert and oriented X 3. Moves all extremities spontaneously. Psych: Normal affect.  Patient  was seen by Dr. Tresa EndoKelly and deemed stable for discharge home. Follow up in the office have been arranged.   Did the patient have an acute coronary syndrome (MI, NSTEMI, STEMI, etc) this admission?:  No                               Did the patient have a percutaneous coronary intervention (stent / angioplasty)?:  No.       _____________  Discharge Vitals Blood pressure 131/78, pulse 67, temperature 97.7 F (36.5 C), temperature source Oral, resp. rate 18, height 5' 8.5" (1.74 m), weight 87 kg, SpO2 99 %.  Filed Weights   08/13/20 2200 08/14/20 1552 08/15/20 0352  Weight: 90.7 kg 87.9 kg 87 kg    Labs & Radiologic Studies    CBC Recent Labs    08/13/20 2211 08/15/20 0239  WBC 11.1* 10.6*  HGB 15.4 14.5  HCT 44.8 42.4  MCV 89.1 88.9  PLT 276 250   Basic Metabolic Panel Recent Labs    96/05/5404/19/22 2211 08/15/20 0239  NA 136 136  K 4.2 3.8  CL 101 104  CO2 26 24  GLUCOSE 174* 184*  BUN 11 12  CREATININE 0.91 0.76  CALCIUM 9.9 9.4   Liver Function Tests No results for input(s): AST, ALT, ALKPHOS, BILITOT, PROT, ALBUMIN in the last 72 hours. No results for input(s): LIPASE, AMYLASE in the last 72 hours. High Sensitivity Troponin:   Recent Labs  Lab 08/13/20 2211 08/14/20 0001  TROPONINIHS <2 4    BNP Invalid input(s): POCBNP D-Dimer No results for input(s): DDIMER in the last 72 hours. Hemoglobin A1C No results for input(s): HGBA1C in the last 72 hours. Fasting Lipid Panel No results for input(s): CHOL, HDL, LDLCALC, TRIG, CHOLHDL, LDLDIRECT in the last 72 hours. Thyroid Function Tests No results for input(s): TSH, T4TOTAL, T3FREE, THYROIDAB in the last 72 hours.  Invalid input(s): FREET3 _____________  DG Chest 2 View  Result Date: 08/13/2020 CLINICAL DATA:  Chest pain and shortness of breath EXAM: CHEST - 2 VIEW COMPARISON:  06/17/2020 FINDINGS: The heart size and mediastinal contours are within normal limits. Both lungs are clear. The visualized skeletal  structures are unremarkable. IMPRESSION: No active cardiopulmonary disease. Electronically Signed   By: Alcide CleverMark  Lukens M.D.   On: 08/13/2020 22:46   CARDIAC CATHETERIZATION  Result Date: 08/15/2020 Formatting of this result is different from the original. Images from the original result were not included.  Prox LAD lesion is 40% stenosed.  Luster Landsbergimothy Ricco is a 55 y.o. male  [id number] LOCATION:  FACILITY: [facility] PHYSICIAN: Nanetta BattyJonathan Berry, M.D. 07/18/1965 DATE OF PROCEDURE:  08/15/2020 DATE OF DISCHARGE: CARDIAC CATHETERIZATION History obtained from chart review.Mr. Loura PardonHudspeth is followed by Dr. Jens Somrenshaw and was recently found to have an elevated CAC of 1693 with moderate stenosis to the proximal/mid LAD and severe stenosis in the distal LAD that was FFR positive.  During clinic follow-up he was only having dyspnea with exertional activities however he has noted that the symptoms have become  more frequent and progressive over the past few months.  He had no similar symptoms prior to April 2022 when he had a syncopal event at home with his family.   In April he had a presyncope/syncope event while at home with his family and was admitted with altered consciousness and memory loss along with chest pain and a questionable left facial droop.  TTE during that time showed a normal LVEF.  CTA of the head and neck along with brain MRI were unremarkable.  EEG was also normal at that time.  Lab work including a troponin and D-dimer within normal limits.   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  This past week he had symptoms with walking around doing minimal physical activity at work.  He was plan for coronary evaluation this Thursday however on Saturday he was out with his sister and had an acute onset episode of shortness of breath while they were at lunch followed by severe right upper  extremity, back, and chest discomfort.  This lasted for several hours before eventually going away.  He had similar recurrent symptoms on Sunday when he was at home with his family and sitting down doing nothing.  The pain again was fairly severe and occurred at rest.  His family at that time strongly encouraged him to seek further evaluation in the ED.  During most these episodes he also has accompanying nausea.  He denies any orthopnea, PND, weight gain, or lower extremity edema.   IMPRESSION:Mr Housand has normal right heart pressures and minimally obstructive CAD.  I believe that his coronary CTA overcalled the degree of obstructive disease.  Medical therapy will be recommended.  The sheath was removed and a TR band was placed on the right wrist to achieve patent hemostasis.  The patient left lab in stable condition. Nanetta Batty. MD, [facility] 08/15/2020 11:36 AM   Cardiac event monitor  Result Date: 07/31/2020 Sinus bradycardia, NSR, sinus tachycardia; rare PVC. Olga Millers   CT CORONARY MORPH W/CTA COR W/SCORE W/CA W/CM &/OR WO/CM  Result Date: 08/04/2020 HISTORY: Pericardial disease suspected EXAM: Cardiac/Coronary  CT TECHNIQUE: The patient was scanned on a [employer]. PROTOCOL: A 120 kV prospective scan was triggered in the descending thoracic aorta at 111 HU's. Axial non-contrast 3 mm slices were carried out through the heart. The data set was analyzed on a dedicated work station and scored using the Agatston method. Gantry rotation speed was 250 msecs and collimation was .6 mm. Beta blockade and 0.8 mg of sl NTG was given. The 3D data set was reconstructed in 5% intervals of the 35-75 % of the R-R cycle. Systolic and diastolic phases were analyzed on a dedicated work station using MPR, MIP and VRT modes. The patient received OMNIPAQUE IOHEXOL 350 MG/ML SOLN contrast. FINDINGS: Image quality: Average Noise artifact is: Moderate, reduced signal to noise ratio. Coronary calcium score  is 1693, which places the patient in the 99th percentile for age and sex matched control. Coronary arteries: Normal coronary origins.  Right dominance. Right Coronary Artery: Serial mild mixed atherosclerotic plaque in the proximal RCA, 25-49% stenosis. Mild mixed atherosclerotic plaque in the mid and distal RCA, 25-49% stenosis. Patent PDA and PLA. Left Main Coronary Artery: Minimal mixed atherosclerotic plaque in the distal LM, <25% stenosis. Left Anterior Descending Coronary Artery: Diffusely diseased ostial to proximal  LAD. Serial moderate mixed atherosclerotic stenoses in the proximal and mid LAD, 50-69% stenosis. Image quality limits precise assessment of luminal stenosis. Probable severe stenosis in the distal LAD 70-99% stenosis. Left Circumflex Artery: Mild scattered mixed atherosclerotic plaque in the proximal L circumflex, 25-49% stenosis. Aorta: Borderline dilation, 38 mm at the mid ascending aorta (level of the PA bifurcation), and 37 x 37 x 37 mm at the sinus of Valsalva, measured double oblique. No dissection, trivial calcifications. Aortic Valve: No calcifications. Other findings: Normal pulmonary vein drainage into the left atrium. Normal left atrial appendage without a thrombus. Severe dilation of main pulmonary artery, 40 mm, suggestive of pulmonary hypertension. IMPRESSION: 1. Moderate-severe CAD in the proximal, mid and distal LAD, CADRADS = 4. CT FFR will be performed and reported separately. 2. Coronary calcium score is 1693, which places the patient in the 99th percentile for age and sex matched control. 3. Normal coronary origin with right dominance. 4. Severe dilation of main pulmonary artery, 40 mm, suggestive of pulmonary hypertension. 5. Borderline dilation of ascending aorta, 38 mm at mid ascending aorta. Electronically Signed   By: Weston Brass   On: 08/04/2020 15:47   CT CORONARY FRACTIONAL FLOW RESERVE DATA PREP  Addendum Date: 08/07/2020   ADDENDUM REPORT: 08/07/2020 13:47  EXAM: OVER-READ INTERPRETATION  CT CHEST The following report is an over-read performed by radiologist Dr. Royal Piedra [facility] on 08/07/2020. This over-read does not include interpretation of cardiac or coronary anatomy or pathology. The coronary calcium score and cardiac CTA interpretation by the cardiologist is attached. COMPARISON:  None. FINDINGS: Atherosclerotic calcifications in the thoracic aorta. Dilatation of the pulmonic trunk (3.9 cm in diameter). Within the visualized portions of the thorax there are no suspicious appearing pulmonary nodules or masses, there is no acute consolidative airspace disease, no pleural effusions, no pneumothorax and no lymphadenopathy. Visualized portions of the upper abdomen are unremarkable. There are no aggressive appearing lytic or blastic lesions noted in the visualized portions of the skeleton. IMPRESSION: 1. Dilatation of the pulmonic trunk (3.9 cm in diameter), concerning for pulmonary arterial hypertension. 2. Aortic Atherosclerosis (ICD10-I70.0). Electronically Signed   By: Trudie Reed M.D.   On: 08/07/2020 13:47   Result Date: 08/07/2020 EXAM: CT FFR ANALYSIS CLINICAL DATA:  abnormal cardiac CT FINDINGS: FFRct analysis was performed on the original cardiac CT angiogram dataset. Diagrammatic representation of the FFRct analysis is provided in a separate PDF document in PACS. This dictation was created using the PDF document and an interactive 3D model of the results. 3D model is not available in the EMR/PACS. Normal FFR range is >0.80. Indeterminate (grey) zone is 0.76-0.80. 1. Left Main: FFR = 0.97 2. LAD: Proximal FFR = 0.92, Mid FFR = 0.85, Distal FFR = 0.73 3. LCX: Proximal FFR = 0.98, distal FFR = 0.89 4. RCA: Proximal FFR = 0.96, Mid FFR =0.92, Distal FFR = 0.86 IMPRESSION: 1. CT FFR analysis showed significant stenosis in the distal LAD, consistent with the severe focal stenosis in the distal LAD noted on CCTA. This is a small  caliber portion of the vessel. RECOMMENDATIONS: Guideline-directed medical therapy and aggressive risk factor modification for secondary prevention of coronary artery disease. If symptoms persist, consider coronary angiography given significant calcifications in LAD with images subject to artifact. Electronically Signed: By: Weston Brass On: 08/04/2020 17:02   Disposition   Pt is being discharged home today in good condition.  Follow-up Plans & Appointments     Follow-up Information  Lewayne Bunting, MD Follow up on 10/18/2020.   Specialty: Cardiology Why: at 11:40am for your follow up appt Contact information: [address] [place] [address] [phone]                Discharge Instructions     Diet - low sodium heart healthy   Complete by: As directed    Discharge instructions   Complete by: As directed    Radial Site Care Refer to this sheet in the next few weeks. These instructions provide you with information on caring for yourself after your procedure. Your caregiver may also give you more specific instructions. Your treatment has been planned according to current medical practices, but problems sometimes occur. Call your caregiver if you have any problems or questions after your procedure. HOME CARE INSTRUCTIONS You may shower the day after the procedure. Remove the bandage (dressing) and gently wash the site with plain soap and water. Gently pat the site dry.  Do not apply powder or lotion to the site.  Do not submerge the affected site in water for 3 to 5 days.  Inspect the site at least twice daily.  Do not flex or bend the affected arm for 24 hours.  No lifting over 5 pounds (2.3 kg) for 5 days after your procedure.  Do not drive home if you are discharged the same day of the procedure. Have someone else drive you.  You may drive 24 hours after the procedure unless otherwise instructed by your caregiver.  What to expect: Any  bruising will usually fade within 1 to 2 weeks.  Blood that collects in the tissue (hematoma) may be painful to the touch. It should usually decrease in size and tenderness within 1 to 2 weeks.  SEEK IMMEDIATE MEDICAL CARE IF: You have unusual pain at the radial site.  You have redness, warmth, swelling, or pain at the radial site.  You have drainage (other than a small amount of blood on the dressing).  You have chills.  You have a fever or persistent symptoms for more than 72 hours.  You have a fever and your symptoms suddenly get worse.  Your arm becomes pale, cool, tingly, or numb.  You have heavy bleeding from the site. Hold pressure on the site.   Increase activity slowly   Complete by: As directed        Discharge Medications   Allergies as of 08/15/2020   No Known Allergies      Medication List     TAKE these medications    aspirin 81 MG EC tablet Take 1 tablet (81 mg total) by mouth daily. Swallow whole. What changed: when to take this   famotidine 40 MG tablet Commonly known as: PEPCID Take 40 mg by mouth at bedtime.   Janumet 50-1000 MG tablet Generic drug: sitaGLIPtin-metformin Take 1 tablet by mouth 2 (two) times daily with a meal.   lisinopril 5 MG tablet Commonly known as: ZESTRIL Take 5 mg by mouth at bedtime.   metoprolol succinate 25 MG 24 hr tablet Commonly known as: Toprol XL Take 1 tablet (25 mg total) by mouth daily. What changed: when to take this   rosuvastatin 40 MG tablet Commonly known as: CRESTOR Take 40 mg by mouth at bedtime.   tamsulosin 0.4 MG Caps capsule Commonly known as: FLOMAX Take 0.8 mg by mouth at bedtime.   [name] FlexTouch 100 UNIT/ML FlexTouch Pen Generic drug: insulin degludec Inject 46 Units  into the skin at bedtime.         Outstanding Labs/Studies   N/a   Duration of Discharge Encounter   Greater than 30 minutes including physician time.  Signed, Laverda Page, NP 08/15/2020, 2:08  PM   Patient seen and examined. Agree with assessment and plan.Pt feels well. I reviewed R and L heart cath findings. Discussed need for aggressive lipid lowering to induce plaque regression. Will increase rosuvastatin to 40 mg. Radial/brachial cath sites stable. DC today. F/U with Dr. Jens Som.   Lennette Bihari, MD, [facility] 08/15/2020 2:09 PM

## 2020-08-15 NOTE — Plan of Care (Signed)

## 2020-08-15 NOTE — Progress Notes (Signed)
ANTICOAGULATION CONSULT NOTE  Pharmacy Consult for heparin Indication: chest pain/ACS   Labs: Recent Labs    08/13/20 2211 08/14/20 0001 08/14/20 1604 08/15/20 0239  HGB 15.4  --   --  14.5  HCT 44.8  --   --  42.4  PLT 276  --   --  250  HEPARINUNFRC  --   --  <0.10* 0.23*  CREATININE 0.91  --   --  0.76  TROPONINIHS <2 4  --   --      Assessment: 55 y.o. male with chest pain for heparin   Goal of Therapy:  Heparin level 0.3-0.7 units/ml   Plan:  Increase Heparin1600 units/hr Follow up after cath today   Geannie Risen, PharmD, BCPS  08/15/2020,4:41 AM

## 2020-08-15 NOTE — Progress Notes (Signed)
D/C instructions given and reviewed. Awaiting TR band removal prior to D/C.

## 2020-08-15 NOTE — Interval H&P Note (Signed)
Cath Lab Visit (complete for each Cath Lab visit)  Clinical Evaluation Leading to the Procedure:   ACS: No.  Non-ACS:    Anginal Classification: CCS II  Anti-ischemic medical therapy: Minimal Therapy (1 class of medications)  Non-Invasive Test Results: Low-risk stress test findings: cardiac mortality <1%/year  Prior CABG: No previous CABG      History and Physical Interval Note:  08/15/2020 10:39 AM  Eric Bauer  has presented today for surgery, with the diagnosis of nstemi.  The various methods of treatment have been discussed with the patient and family. After consideration of risks, benefits and other options for treatment, the patient has consented to  Procedure(s): RIGHT/LEFT HEART CATH AND CORONARY ANGIOGRAPHY (N/A) as a surgical intervention.  The patient's history has been reviewed, patient examined, no change in status, stable for surgery.  I have reviewed the patient's chart and labs.  Questions were answered to the patient's satisfaction.     Nanetta Batty

## 2020-08-17 ENCOUNTER — Encounter (HOSPITAL_COMMUNITY): Admission: RE | Payer: Self-pay | Source: Home / Self Care

## 2020-08-17 ENCOUNTER — Telehealth: Payer: Self-pay | Admitting: Cardiology

## 2020-08-17 ENCOUNTER — Ambulatory Visit (HOSPITAL_COMMUNITY)
Admission: RE | Admit: 2020-08-17 | Payer: PRIVATE HEALTH INSURANCE | Source: Home / Self Care | Admitting: Cardiovascular Disease

## 2020-08-17 SURGERY — RIGHT/LEFT HEART CATH AND CORONARY ANGIOGRAPHY
Anesthesia: LOCAL

## 2020-08-17 NOTE — Telephone Encounter (Signed)
New Message:    Pt wants to know if he can remove bandage where he had Cath on Tuesday to shower please?

## 2020-08-17 NOTE — Telephone Encounter (Signed)
Called patient, advised of the discharge instructions in regards to the care of the site.  Patient verbalized understanding.  No further questions.

## 2020-09-04 ENCOUNTER — Inpatient Hospital Stay: Payer: PRIVATE HEALTH INSURANCE | Admitting: Neurology

## 2020-10-09 NOTE — Progress Notes (Signed)
HPI: FU CAD and presyncope. Patient previously seen in [facility] by Jeralene Huff MD.  There was a question of atrial septal defect.  However transesophageal echocardiogram December 2019 showed normal LV function, mild right ventricular enlargement, no atrial septal defect or other congenital abnormalities and no valvular heart disease noted. Echocardiogram April 2022 showed normal LV function.  EEG was normal.  Brain MRI normal.  CTA of the head and neck showed no disease. Troponins and Ddimer were normal.  Monitor 6/22 showed sinus with rare PVC. 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 FFR showed significant stenosis in the distal LAD.  Cardiac catheterization June 2022 showed 40% proximal LAD.  Since last seen, the patient has dyspnea with more extreme activities but not with routine activities. It is relieved with rest. It is not associated with chest pain. There is no orthopnea, PND or pedal edema. There is no syncope or palpitations. There is no exertional chest pain.   Current Outpatient Medications  Medication Sig Dispense Refill   aspirin EC 81 MG EC tablet Take 1 tablet (81 mg total) by mouth daily. Swallow whole. 30 tablet 11   famotidine (PEPCID) 40 MG tablet Take 40 mg by mouth at bedtime.     JANUMET 50-1000 MG tablet Take 1 tablet by mouth 2 (two) times daily with a meal.     lisinopril (ZESTRIL) 5 MG tablet Take 5 mg by mouth at bedtime.     metoprolol succinate (TOPROL XL) 25 MG 24 hr tablet Take 1 tablet (25 mg total) by mouth daily. 90 tablet 3   rosuvastatin (CRESTOR) 40 MG tablet Take 40 mg by mouth at bedtime.     tamsulosin (FLOMAX) 0.4 MG CAPS capsule Take 0.8 mg by mouth at bedtime.     TRESIBA FLEXTOUCH 100 UNIT/ML FlexTouch Pen Inject 46 Units into the skin at bedtime.     No current facility-administered medications for this visit.     Past  Medical History:  Diagnosis Date   Diabetes mellitus (HCC)    Hyperlipidemia    Hypertension     Past Surgical History:  Procedure Laterality Date   No prior surgery     RIGHT HEART CATH AND CORONARY ANGIOGRAPHY N/A 08/15/2020   Procedure: RIGHT HEART CATH AND CORONARY ANGIOGRAPHY;  Surgeon: Runell Gess, MD;  Location: [facility];  Service: Cardiovascular;  Laterality: N/A;    Social History   Socioeconomic History   Marital status: Married    Spouse name: Not on file   Number of children: 5   Years of education: Not on file   Highest education level: Not on file  Occupational History   Not on file  Tobacco Use   Smoking status: Never   Smokeless tobacco: Never  Substance and Sexual Activity   Alcohol use: Not Currently   Drug use: Not on file   Sexual activity: Not on file  Other Topics Concern   Not on file  Social History Narrative   Not on file   Social Determinants of Health   Financial Resource Strain: Not on file  Food Insecurity: Not on file  Transportation Needs: Not on file  Physical Activity: Not on file  Stress: Not on file  Social Connections: Not on file  Intimate Partner Violence: Not on file    Family History  Problem Relation  Age of Onset   Liver cancer Mother    Heart attack Father     ROS: no fevers or chills, productive cough, hemoptysis, dysphasia, odynophagia, melena, hematochezia, dysuria, hematuria, rash, seizure activity, orthopnea, PND, pedal edema, claudication. Remaining systems are negative.  Physical Exam: Well-developed well-nourished in no acute distress.  Skin is warm and dry.  HEENT is normal.  Neck is supple.  Chest is clear to auscultation with normal expansion.  Cardiovascular exam is regular rate and rhythm.  Abdominal exam nontender or distended. No masses palpated. Extremities show no edema. neuro grossly intact  A/P  1 coronary artery disease-nonobstructive on recent catheterization.  Plan to  continue medical therapy with aspirin and statin.  2 hyperlipidemia-continue statin.  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  Recent cardiac catheterization did not reveal elevated pulmonary pressures. Previous TEE in [facility] apparently showed no ASD.  4 presyncope-no recurrences; neurology evaluated previously and felt like episodes may be vagal in etiology. Olga Millers, MD

## 2020-10-18 ENCOUNTER — Encounter: Payer: Self-pay | Admitting: Cardiology

## 2020-10-18 ENCOUNTER — Other Ambulatory Visit: Payer: Self-pay

## 2020-10-18 ENCOUNTER — Ambulatory Visit: Payer: No Typology Code available for payment source | Admitting: Cardiology

## 2020-10-18 VITALS — BP 124/64 | HR 91 | Ht 68.5 in | Wt 197.1 lb

## 2020-10-18 DIAGNOSIS — E7800 Pure hypercholesterolemia, unspecified: Secondary | ICD-10-CM

## 2020-10-18 DIAGNOSIS — I2510 Atherosclerotic heart disease of native coronary artery without angina pectoris: Secondary | ICD-10-CM | POA: Diagnosis not present

## 2020-10-18 DIAGNOSIS — R55 Syncope and collapse: Secondary | ICD-10-CM

## 2020-10-18 NOTE — Patient Instructions (Signed)

## 2021-02-26 ENCOUNTER — Encounter (HOSPITAL_BASED_OUTPATIENT_CLINIC_OR_DEPARTMENT_OTHER): Payer: No Typology Code available for payment source | Admitting: Cardiovascular Disease

## 2021-08-02 DIAGNOSIS — F339 Major depressive disorder, recurrent, unspecified: Secondary | ICD-10-CM | POA: Diagnosis not present

## 2021-08-02 DIAGNOSIS — F411 Generalized anxiety disorder: Secondary | ICD-10-CM | POA: Diagnosis not present

## 2021-08-27 DIAGNOSIS — F339 Major depressive disorder, recurrent, unspecified: Secondary | ICD-10-CM | POA: Diagnosis not present

## 2021-08-27 DIAGNOSIS — R6889 Other general symptoms and signs: Secondary | ICD-10-CM | POA: Diagnosis not present

## 2021-08-27 DIAGNOSIS — Z79899 Other long term (current) drug therapy: Secondary | ICD-10-CM | POA: Diagnosis not present

## 2021-10-04 DIAGNOSIS — Z0000 Encounter for general adult medical examination without abnormal findings: Secondary | ICD-10-CM | POA: Diagnosis not present

## 2021-10-04 DIAGNOSIS — E6609 Other obesity due to excess calories: Secondary | ICD-10-CM | POA: Diagnosis not present

## 2021-10-04 DIAGNOSIS — E1169 Type 2 diabetes mellitus with other specified complication: Secondary | ICD-10-CM | POA: Diagnosis not present

## 2021-10-04 DIAGNOSIS — E1165 Type 2 diabetes mellitus with hyperglycemia: Secondary | ICD-10-CM | POA: Diagnosis not present

## 2021-10-04 DIAGNOSIS — Z23 Encounter for immunization: Secondary | ICD-10-CM | POA: Diagnosis not present

## 2021-10-04 DIAGNOSIS — K219 Gastro-esophageal reflux disease without esophagitis: Secondary | ICD-10-CM | POA: Diagnosis not present

## 2021-10-05 DIAGNOSIS — E1169 Type 2 diabetes mellitus with other specified complication: Secondary | ICD-10-CM | POA: Diagnosis not present

## 2021-10-05 DIAGNOSIS — Z794 Long term (current) use of insulin: Secondary | ICD-10-CM | POA: Diagnosis not present

## 2021-10-05 DIAGNOSIS — R350 Frequency of micturition: Secondary | ICD-10-CM | POA: Diagnosis not present

## 2021-10-05 DIAGNOSIS — K219 Gastro-esophageal reflux disease without esophagitis: Secondary | ICD-10-CM | POA: Diagnosis not present

## 2021-10-05 DIAGNOSIS — Z23 Encounter for immunization: Secondary | ICD-10-CM | POA: Diagnosis not present

## 2021-10-05 DIAGNOSIS — Z0000 Encounter for general adult medical examination without abnormal findings: Secondary | ICD-10-CM | POA: Diagnosis not present

## 2021-10-05 DIAGNOSIS — Z6833 Body mass index (BMI) 33.0-33.9, adult: Secondary | ICD-10-CM | POA: Diagnosis not present

## 2021-10-05 DIAGNOSIS — E785 Hyperlipidemia, unspecified: Secondary | ICD-10-CM | POA: Diagnosis not present

## 2021-10-05 DIAGNOSIS — E1165 Type 2 diabetes mellitus with hyperglycemia: Secondary | ICD-10-CM | POA: Diagnosis not present

## 2021-10-05 DIAGNOSIS — N401 Enlarged prostate with lower urinary tract symptoms: Secondary | ICD-10-CM | POA: Diagnosis not present

## 2021-10-05 DIAGNOSIS — Z125 Encounter for screening for malignant neoplasm of prostate: Secondary | ICD-10-CM | POA: Diagnosis not present

## 2021-10-05 DIAGNOSIS — E6609 Other obesity due to excess calories: Secondary | ICD-10-CM | POA: Diagnosis not present

## 2021-11-14 ENCOUNTER — Telehealth: Payer: Self-pay | Admitting: Cardiology

## 2021-11-14 DIAGNOSIS — I10 Essential (primary) hypertension: Secondary | ICD-10-CM | POA: Diagnosis not present

## 2021-11-14 DIAGNOSIS — Z20822 Contact with and (suspected) exposure to covid-19: Secondary | ICD-10-CM | POA: Diagnosis not present

## 2021-11-14 DIAGNOSIS — R0789 Other chest pain: Secondary | ICD-10-CM | POA: Diagnosis not present

## 2021-11-14 DIAGNOSIS — R079 Chest pain, unspecified: Secondary | ICD-10-CM | POA: Diagnosis not present

## 2021-11-14 DIAGNOSIS — R0602 Shortness of breath: Secondary | ICD-10-CM | POA: Diagnosis not present

## 2021-11-14 DIAGNOSIS — I2510 Atherosclerotic heart disease of native coronary artery without angina pectoris: Secondary | ICD-10-CM | POA: Diagnosis not present

## 2021-11-14 DIAGNOSIS — I493 Ventricular premature depolarization: Secondary | ICD-10-CM | POA: Diagnosis not present

## 2021-11-14 MED ORDER — METOPROLOL SUCCINATE ER 25 MG PO TB24: 25.0000 mg | ORAL_TABLET | Freq: Every day | ORAL | 3 refills | Status: DC

## 2021-11-14 NOTE — Telephone Encounter (Signed)
Pt c/o of Chest Pain: STAT if CP now or developed within 24 hours  1. Are you having CP right now? This morning  2. Are you experiencing any other symptoms (ex. SOB, nausea, vomiting, sweating)? Sob, nausea, headache  3. How long have you been experiencing CP? today  4. Is your CP continuous or coming and going? continuous  5. Have you taken Nitroglycerin? No  ? Have appt tomorrow with Monge at 10:30

## 2021-11-14 NOTE — Telephone Encounter (Signed)
Spoke with pt he states that he woke up about 5:30am this morning with chest pain, SOB, headache and nausea. He states that it is 10/10. He thought that it would "go away" so he went ahead and got ready and proceeded to work. He states that he was sent home from work and he thought that he should just rest for the rest of the day and see how it goes  after that. Informed pt that if the chest pain has been lasting all day he needs to go directly to the ER to be evaluated. Pt asks if he can just rest for awhile. Informed pt that he needs to go to the ER now, it already has been too long. Verbalized understanding. He will go now.

## 2021-11-15 ENCOUNTER — Ambulatory Visit: Payer: No Typology Code available for payment source | Admitting: Nurse Practitioner

## 2021-11-15 DIAGNOSIS — R0602 Shortness of breath: Secondary | ICD-10-CM | POA: Diagnosis not present

## 2021-11-15 DIAGNOSIS — R0789 Other chest pain: Secondary | ICD-10-CM | POA: Diagnosis not present

## 2021-11-15 DIAGNOSIS — Z20822 Contact with and (suspected) exposure to covid-19: Secondary | ICD-10-CM | POA: Diagnosis not present

## 2021-11-15 DIAGNOSIS — I491 Atrial premature depolarization: Secondary | ICD-10-CM | POA: Diagnosis not present

## 2021-11-15 DIAGNOSIS — R079 Chest pain, unspecified: Secondary | ICD-10-CM | POA: Diagnosis not present

## 2021-11-15 NOTE — Progress Notes (Deleted)
Office Visit    Patient Name: Eric Bauer Date of Encounter: 11/15/2021  Primary Care Provider:  Ranae Pila, FNP Primary Cardiologist:  Olga Millers, MD  Chief Complaint    56 year old male with a history of CAD, presyncope, dilated pulmonary artery, hypertension, hyperlipidemia, and type 2 diabetes who presents for follow-up related to CAD and chest pain.  Past Medical History    Past Medical History:  Diagnosis Date   Diabetes mellitus (HCC)    Hyperlipidemia    Hypertension    Past Surgical History:  Procedure Laterality Date   No prior surgery     RIGHT HEART CATH AND CORONARY ANGIOGRAPHY N/A 08/15/2020   Procedure: RIGHT HEART CATH AND CORONARY ANGIOGRAPHY;  Surgeon: Runell Gess, MD;  Location: [facility]VAS[facility] CV [facility];  Service: Cardiovascular;  Laterality: N/A;    Allergies  No Known Allergies  History of Present Illness    56 year old male with the above past medical history including CAD, presyncope, dilated pulmonary artery, hypertension, hyperlipidemia, and type 2 diabetes.  He was previously evaluated by Dr. Jeralene Huff in [facility].  There was a question of atrial septal defect at the time.  TEE in December 2019 showed normal LV function, mild RV enlargement, no atrial septal or other congenital abnormalities, no significant valvular heart disease.  Echocardiogram in April 2022 showed normal LV function.  He has a history of presyncope.  He was evaluated by neurology.  EEG was normal, MRI of the brain was unremarkable, CTA of the head and neck showed no disease.  Cardiac monitor in 07/2020 showed sinus rhythm with rare PVCs PACs.  His presyncope was thought to be vasovagal in nature.  Coronary CTA in 07/2020 showed coronary calcium score of 1693, moderate stenosis in the proximal mid LAD, severe stenosis of the distal LAD, dilated pulmonary artery at 40 mm.  FFR showed significant stenosis in the distal LAD.  Follow-up cardiac catheterization in June 2022 showed  40% proximal LAD stenosis.  Ongoing medical therapy was recommended.  He was last seen in the office on 10/19/2018 reported some mild dyspnea on exertion. He denied chest pain.  At our office on 11/14/2020 with complaints of chest pain.  He was advised to go to the ED but declined.  He presents today for follow-up.  Since his last visit he  CAD/chest pain: Hypertension: Hyperlipidemia: Dilated pulmonary artery: Presyncope: Type 2 diabetes:  Disposition:  Home Medications    Current Outpatient Medications  Medication Sig Dispense Refill   aspirin EC 81 MG EC tablet Take 1 tablet (81 mg total) by mouth daily. Swallow whole. 30 tablet 11   famotidine (PEPCID) 40 MG tablet Take 40 mg by mouth at bedtime.     JANUMET 50-1000 MG tablet Take 1 tablet by mouth 2 (two) times daily with a meal.     lisinopril (ZESTRIL) 5 MG tablet Take 5 mg by mouth at bedtime.     metoprolol succinate (TOPROL XL) 25 MG 24 hr tablet Take 1 tablet (25 mg total) by mouth daily. 90 tablet 3   rosuvastatin (CRESTOR) 40 MG tablet Take 40 mg by mouth at bedtime.     tamsulosin (FLOMAX) 0.4 MG CAPS capsule Take 0.8 mg by mouth at bedtime.     TRESIBA FLEXTOUCH 100 UNIT/ML FlexTouch Pen Inject 46 Units into the skin at bedtime.     No current facility-administered medications for this visit.     Review of Systems    ***.  All other systems  reviewed and are otherwise negative except as noted above.    Physical Exam    VS:  There were no vitals taken for this visit. , BMI There is no height or weight on file to calculate BMI.     GEN: Well nourished, well developed, in no acute distress. HEENT: normal. Neck: Supple, no JVD, carotid bruits, or masses. Cardiac: RRR, no murmurs, rubs, or gallops. No clubbing, cyanosis, edema.  Radials/DP/PT 2+ and equal bilaterally.  Respiratory:  Respirations regular and unlabored, clear to auscultation bilaterally. GI: Soft, nontender, nondistended, BS + x 4. MS: no deformity or  atrophy. Skin: warm and dry, no rash. Neuro:  Strength and sensation are intact. Psych: Normal affect.  Accessory Clinical Findings    ECG personally reviewed by me today - *** - no acute changes.   Lab Results  Component Value Date   WBC 10.6 (H) 08/15/2020   HGB 13.9 08/15/2020   HCT 41.0 08/15/2020   MCV 88.9 08/15/2020   PLT 250 08/15/2020   Lab Results  Component Value Date   CREATININE 0.76 08/15/2020   BUN 12 08/15/2020   NA 141 08/15/2020   K 3.9 08/15/2020   CL 104 08/15/2020   CO2 24 08/15/2020   Lab Results  Component Value Date   ALT 17 06/18/2020   AST 18 06/18/2020   ALKPHOS 38 06/18/2020   BILITOT 2.1 (H) 06/18/2020   Lab Results  Component Value Date   CHOL 93 06/18/2020   HDL 34 (L) 06/18/2020   LDLCALC 47 06/18/2020   TRIG 62 06/18/2020   CHOLHDL 2.7 06/18/2020    Lab Results  Component Value Date   HGBA1C 9.3 (H) 06/18/2020    Assessment & Plan    1.  ***  No BP recorded.  {Refresh Note OR Click here to enter BP  :1}***   Lenna Sciara, NP 11/15/2021, 4:57 AM

## 2021-11-20 NOTE — Progress Notes (Unsigned)
Cardiology Office Note:    Date:  11/21/2021   ID:  Eric Bauer, DOB 01-Mar-1965, MRN [id number]  PCP:  Ranae Pila, FNP [facility] Cardiologist: Olga Millers, MD   Reason for visit: Hospital follow-up  History of Present Illness:    Eric Bauer is a 56 y.o. male with a hx of CAD, diabetes, hypertension & hyperlipidemia.  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 FFR showed significant stenosis in the distal LAD.  Cardiac catheterization June 2022 showed 40% proximal LAD.    Patient last saw Dr. Sandi Carne in 09/2020 and noted dyspnea with more extreme activities but not with routine activities.  No chest pain.  Follow-up recommended in 1 year.  Went to the ER in November 14, 2021 at [facility] in [place].  Patient noted chest pain started at 5:30 in the morning described as sharp pain in left chest rating to the left arm.  Patient had stress echo 11/15/21 with METS 5.7, Normal left ventricular function and global wall motion with stress. Negative exercise echocardiography for inducible ischemia at target  heart rate. Patient complained of fatigue and shortness of breath during exam.  Today, patient complains of dyspnea on exertion.  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  He also mentions left-sided chest pain which he rates 5 out of 10.  This is not purely exertional but can be worse with heavy lifting.  Chest pain sometimes radiates in the left arm.  May be worse when he lays flat.  He complains of fatigue.  He does not have a diagnosis of sleep apnea but thinks he may have sleep apnea.  He denies a history of tobacco use as well as history of asthma and COPD.  He denies wheezing.  He denies LE edema.  We discussed how his hemoglobin A1c has greatly improved since  last year.  However his lipids have worsened with LDL of 217 in August from LDL 48 last year.  He states he had trouble getting his medication for a while but has been back on for a couple weeks now.    Past Medical History:  Diagnosis Date   Diabetes mellitus (HCC)    Hyperlipidemia    Hypertension     Past Surgical History:  Procedure Laterality Date   No prior surgery     RIGHT HEART CATH AND CORONARY ANGIOGRAPHY N/A 08/15/2020   Procedure: RIGHT HEART CATH AND CORONARY ANGIOGRAPHY;  Surgeon: Runell Gess, MD;  Location: [facility];  Service: Cardiovascular;  Laterality: N/A;    Current Medications: Current Meds  Medication Sig   aspirin EC 81 MG EC tablet Take 1 tablet (81 mg total) by mouth daily. Swallow whole.   famotidine (PEPCID) 40 MG tablet Take 40 mg by mouth at bedtime.   isosorbide mononitrate (IMDUR) 30 MG 24 hr tablet Take 0.5 tablets (15 mg total) by mouth daily.   JANUMET 50-1000 MG tablet Take 1 tablet by mouth 2 (two) times daily with a meal.   metoprolol succinate (TOPROL XL) 25 MG 24 hr tablet Take 1 tablet (25 mg total) by mouth daily.   omeprazole (PRILOSEC) 20 MG capsule Take by mouth.   rosuvastatin (CRESTOR) 40 MG tablet Take 40 mg by mouth at bedtime.   tamsulosin (  FLOMAX) 0.4 MG CAPS capsule Take 0.8 mg by mouth at bedtime.   TRESIBA FLEXTOUCH 100 UNIT/ML FlexTouch Pen Inject 46 Units into the skin at bedtime.   [DISCONTINUED] lisinopril (ZESTRIL) 5 MG tablet Take 5 mg by mouth at bedtime.     Allergies:   Patient has no known allergies.   Social History   Socioeconomic History   Marital status: Married    Spouse name: Not on file   Number of children: 5   Years of education: Not on file   Highest education level: Not on file  Occupational History   Not on file  Tobacco Use   Smoking status: Never   Smokeless tobacco: Never  Substance and Sexual Activity   Alcohol use: Not Currently   Drug use: Not on file   Sexual activity:  Not on file  Other Topics Concern   Not on file  Social History Narrative   Not on file   Social Determinants of Health   Financial Resource Strain: Not on file  Food Insecurity: Not on file  Transportation Needs: Not on file  Physical Activity: Not on file  Stress: Not on file  Social Connections: Not on file     Family History: The patient's family history includes Heart attack in his father; Liver cancer in his mother.  ROS:   Please see the history of present illness.     EKGs/Labs/Other Studies Reviewed:    EKG:  The ekg ordered today demonstrates sinus rhythm with PAC with heart rate 63.  Recent Labs: No results found for requested labs within last 365 days.   Recent Lipid Panel Lab Results  Component Value Date/Time   CHOL 93 06/18/2020 04:25 AM   TRIG 62 06/18/2020 04:25 AM   HDL 34 (L) 06/18/2020 04:25 AM   LDLCALC 47 06/18/2020 04:25 AM    Physical Exam:    VS:  BP 122/78   Pulse 63   Ht 5' 8.5" (1.74 m)   Wt 205 lb (93 kg)   SpO2 96%   BMI 30.72 kg/m    No data found.       Wt Readings from Last 3 Encounters:  11/21/21 205 lb (93 kg)  10/18/20 197 lb 1.9 oz (89.4 kg)  08/15/20 191 lb 14.4 oz (87 kg)     GEN:  Well nourished, well developed in no acute distress HEENT: Normal NECK: No JVD; No carotid bruits CARDIAC: RRR with ectopy, no murmurs, rubs, gallops RESPIRATORY:  Clear to auscultation without rales, wheezing or rhonchi  ABDOMEN: Soft, non-tender, non-distended MUSCULOSKELETAL: No edema; No deformity  SKIN: Warm and dry NEUROLOGIC:  Alert and oriented PSYCHIATRIC:  Normal affect    ASSESSMENT AND PLAN   Coronary artery disease with possible progressive angina -Cardiac CTA 6/22 showed Ca score 1693; moderate stenosis in the proximal and mid LAD and severe stenosis in the distal LAD.  FFR showed significant stenosis in the distal LAD.  Cardiac catheterization June 2022 showed 40% proximal LAD.   -Negative stress echo 10/2021 at  [facility] -Given recent testing, will try to maximize antianginal therapies.  Will add Imdur 30 mg half tablet daily.  With the addition of Imdur, recommend decreasing lisinopril to half tablet daily to avoid hypotension. -Discussed the importance of compliance with his cholesterol medication. -Continue aspirin 81 mg daily and Toprol 25 mg daily.  With a heart rate of 63, will defer on titrating Toprol at this time. -Continue diabetic control with last A1c 6.4% in  August 2023.  Suspected sleep apnea -Stop Bang: 7 with snoring, daytime fatigue, HTN, older than 50, large neck size, apneic episodes -Referred for home sleep study.  Dyspnea on exertion -Patient had normal PFTs in August 2022 with [facility] -He denies tobacco use, asthma and COPD. -We will see if symptoms improve with Imdur. -Otherwise, may consider referral to pulmonary.  Hypertension, well controlled -Medications as above. -Goal BP is <130/80.  Recommend DASH diet (high in vegetables, fruits, low-fat dairy products, whole grains, poultry, fish, and nuts and low in sweets, sugar-sweetened beverages, and red meats), salt restriction and increase physical activity.  Hyperlipidemia with goal LDL less than 70 -LDL 217 in August 2023 when off Crestor.  LDL 48 in August 2022 while on Crestor. -Continue Crestor 20 mg daily and recheck lipids in 2 months. -Discussed cholesterol lowering diets - Mediterranean diet, DASH diet, vegetarian diet, low-carbohydrate diet and avoidance of trans fats.  Discussed healthier choice substitutes.  Nuts, high-fiber foods, and fiber supplements may also improve lipids.    Obesity -Discussed how even a 5-10% weight loss can have cardiovascular benefits.   -Recommend moderate intensity activity for 30 minutes 5 days/week and the DASH diet.   Disposition - Follow-up in 2 months with Dr. Jens Som to reevaluate dyspnea on exertion/CP after maximizing anti-anginal therapy.   Medication  Adjustments/Labs and Tests Ordered: Current medicines are reviewed at length with the patient today.  Concerns regarding medicines are outlined above.  Orders Placed This Encounter  Procedures   Lipid panel   EKG 12-Lead   Home sleep test   Meds ordered this encounter  Medications   isosorbide mononitrate (IMDUR) 30 MG 24 hr tablet    Sig: Take 0.5 tablets (15 mg total) by mouth daily.    Dispense:  45 tablet    Refill:  3   lisinopril (ZESTRIL) 5 MG tablet    Sig: Take 0.5 tablets (2.5 mg total) by mouth at bedtime.    Dispense:  45 tablet    Refill:  3    Patient Instructions  Medication Instructions:  START Imdur 30 mg (start with taking 0.5 tablet- 15 mg) daily  Decrease Lisinopril to 0.5 tablet (2.5 mg) daily   *If you need a refill on your cardiac medications before your next appointment, please call your pharmacy*   Lab Work: LIPID (2 months, come back fasting- nothing to eat or drink, no lab appointment needed)   If you have labs (blood work) drawn today and your tests are completely normal, you will receive your results only by: MyChart Message (if you have MyChart) OR A paper copy in the mail If you have any lab test that is abnormal or we need to change your treatment, we will call you to review the results.   Testing/Procedures: Your physician has recommended that you have a sleep study. This test records several body functions during sleep, including: brain activity, eye movement, oxygen and carbon dioxide blood levels, heart rate and rhythm, breathing rate and rhythm, the flow of air through your mouth and nose, snoring, body muscle movements, and chest and belly movement.   Follow-Up: At [facility], you and your health needs are our priority.  As part of our continuing mission to provide you with exceptional heart care, we have created designated Provider Care Teams.  These Care Teams include your primary Cardiologist (physician) and Advanced  Practice Providers (APPs -  Physician Assistants and Nurse Practitioners) who all work together to provide you with  the care you need, when you need it.  We recommend signing up for the patient portal called "MyChart".  Sign up information is provided on this After Visit Summary.  MyChart is used to connect with patients for Virtual Visits (Telemedicine).  Patients are able to view lab/test results, encounter notes, upcoming appointments, etc.  Non-urgent messages can be sent to your provider as well.   To learn more about what you can do with MyChart, go to [email or web address].    Your next appointment:   2 month(s)  The format for your next appointment:   In Person  Provider:   Olga Millers, MD      Let us know if you have any symptoms of lightheadedness or dizziness.      Signed, Cannon Kettle, PA-C  11/21/2021 10:51 AM    [facility]

## 2021-11-21 ENCOUNTER — Encounter: Payer: Self-pay | Admitting: Physician Assistant

## 2021-11-21 ENCOUNTER — Ambulatory Visit: Payer: BC Managed Care – PPO | Attending: Physician Assistant | Admitting: Physician Assistant

## 2021-11-21 VITALS — BP 122/78 | HR 63 | Ht 68.5 in | Wt 205.0 lb

## 2021-11-21 DIAGNOSIS — I2510 Atherosclerotic heart disease of native coronary artery without angina pectoris: Secondary | ICD-10-CM | POA: Diagnosis not present

## 2021-11-21 DIAGNOSIS — E7800 Pure hypercholesterolemia, unspecified: Secondary | ICD-10-CM

## 2021-11-21 DIAGNOSIS — R072 Precordial pain: Secondary | ICD-10-CM

## 2021-11-21 DIAGNOSIS — R5383 Other fatigue: Secondary | ICD-10-CM

## 2021-11-21 DIAGNOSIS — R0683 Snoring: Secondary | ICD-10-CM

## 2021-11-21 MED ORDER — ISOSORBIDE MONONITRATE ER 30 MG PO TB24: 15.0000 mg | ORAL_TABLET | Freq: Every day | ORAL | 3 refills | Status: DC

## 2021-11-21 MED ORDER — LISINOPRIL 5 MG PO TABS: 2.5000 mg | ORAL_TABLET | Freq: Every day | ORAL | 3 refills | Status: DC

## 2021-11-21 NOTE — Patient Instructions (Addendum)
Medication Instructions:  START Imdur 30 mg (start with taking 0.5 tablet- 15 mg) daily  Decrease Lisinopril to 0.5 tablet (2.5 mg) daily   *If you need a refill on your cardiac medications before your next appointment, please call your pharmacy*   Lab Work: LIPID (2 months, come back fasting- nothing to eat or drink, no lab appointment needed)   If you have labs (blood work) drawn today and your tests are completely normal, you will receive your results only by: [place] (if you have MyChart) OR A paper copy in the mail If you have any lab test that is abnormal or we need to change your treatment, we will call you to review the results.   Testing/Procedures: Your physician has recommended that you have a sleep study. This test records several body functions during sleep, including: brain activity, eye movement, oxygen and carbon dioxide blood levels, heart rate and rhythm, breathing rate and rhythm, the flow of air through your mouth and nose, snoring, body muscle movements, and chest and belly movement.   Follow-Up: At [facility], you and your health needs are our priority.  As part of our continuing mission to provide you with exceptional heart care, we have created designated Provider Care Teams.  These Care Teams include your primary Cardiologist (physician) and Advanced Practice Providers (APPs -  Physician Assistants and Nurse Practitioners) who all work together to provide you with the care you need, when you need it.  We recommend signing up for the patient portal called "MyChart".  Sign up information is provided on this After Visit Summary.  MyChart is used to connect with patients for Virtual Visits (Telemedicine).  Patients are able to view lab/test results, encounter notes, upcoming appointments, etc.  Non-urgent messages can be sent to your provider as well.   To learn more about what you can do with MyChart, go to [email or web address].    Your next  appointment:   2 month(s)  The format for your next appointment:   In Person  Provider:   Kirk Ruths, MD      Let us know if you have any symptoms of lightheadedness or dizziness.

## 2021-11-23 ENCOUNTER — Telehealth: Payer: Self-pay | Admitting: *Deleted

## 2021-11-23 NOTE — Telephone Encounter (Signed)
Per Eric Bauer with [facility] @ 1:01 pm no PA is required for HST.

## 2021-12-05 DIAGNOSIS — I10 Essential (primary) hypertension: Secondary | ICD-10-CM | POA: Diagnosis not present

## 2021-12-05 DIAGNOSIS — R072 Precordial pain: Secondary | ICD-10-CM | POA: Diagnosis not present

## 2021-12-05 DIAGNOSIS — Z23 Encounter for immunization: Secondary | ICD-10-CM | POA: Diagnosis not present

## 2021-12-05 DIAGNOSIS — R0609 Other forms of dyspnea: Secondary | ICD-10-CM | POA: Diagnosis not present

## 2021-12-05 DIAGNOSIS — E785 Hyperlipidemia, unspecified: Secondary | ICD-10-CM | POA: Diagnosis not present

## 2021-12-26 ENCOUNTER — Encounter (HOSPITAL_BASED_OUTPATIENT_CLINIC_OR_DEPARTMENT_OTHER): Payer: Self-pay

## 2021-12-26 ENCOUNTER — Ambulatory Visit (HOSPITAL_BASED_OUTPATIENT_CLINIC_OR_DEPARTMENT_OTHER): Payer: No Typology Code available for payment source | Admitting: Cardiovascular Disease

## 2021-12-26 DIAGNOSIS — R0683 Snoring: Secondary | ICD-10-CM

## 2021-12-26 DIAGNOSIS — R5383 Other fatigue: Secondary | ICD-10-CM

## 2022-01-04 ENCOUNTER — Ambulatory Visit (HOSPITAL_BASED_OUTPATIENT_CLINIC_OR_DEPARTMENT_OTHER): Payer: BC Managed Care – PPO | Attending: Physician Assistant | Admitting: Cardiovascular Disease

## 2022-01-04 DIAGNOSIS — R0683 Snoring: Secondary | ICD-10-CM

## 2022-01-04 DIAGNOSIS — G4733 Obstructive sleep apnea (adult) (pediatric): Secondary | ICD-10-CM | POA: Diagnosis not present

## 2022-01-04 DIAGNOSIS — R5383 Other fatigue: Secondary | ICD-10-CM | POA: Diagnosis not present

## 2022-01-14 ENCOUNTER — Encounter (HOSPITAL_BASED_OUTPATIENT_CLINIC_OR_DEPARTMENT_OTHER): Payer: Self-pay | Admitting: Cardiovascular Disease

## 2022-01-14 NOTE — Procedures (Signed)
     Patient Name: Eric Bauer, Eric Bauer Date: 01/05/2022 Gender: Male D.O.B: 09/06/1965 Age (years): 56 Referring Provider: Juanda Crumble PA-C Height (inches): 68 Interpreting Physician: Nicki Guadalajara MD, ABSM Weight (lbs): 205 RPSGT: [place] Sink BMI: 31 MRN: [id number] Neck Size: 17.00  CLINICAL INFORMATION Sleep Study Type: HST  Indication for sleep study: Stop-BANG 7, snoring, fatigue, witnessed apnea  Epworth Sleepiness Score: 15  SLEEP STUDY TECHNIQUE A multi-channel overnight portable sleep study was performed. The channels recorded were: nasal airflow, thoracic respiratory movement, and oxygen saturation with a pulse oximetry. Snoring was also monitored.  MEDICATIONS aspirin EC 81 MG EC tablet famotidine (PEPCID) 40 MG tablet isosorbide mononitrate (IMDUR) 30 MG 24 hr tablet JANUMET 50-1000 MG tablet lisinopril (ZESTRIL) 5 MG tablet metoprolol succinate (TOPROL XL) 25 MG 24 hr tablet omeprazole (PRILOSEC) 20 MG capsule oxybutynin (DITROPAN-XL) 5 MG 24 hr tablet rosuvastatin (CRESTOR) 40 MG tablet tamsulosin (FLOMAX) 0.4 MG CAPS capsule TRESIBA FLEXTOUCH 100  Patient self administered medications include: N/A.  SLEEP ARCHITECTURE Patient was studied for 392.5 minutes. The sleep efficiency was 100.0 % and the patient was supine for 0%. The arousal index was 0.0 per hour.  RESPIRATORY PARAMETERS The overall AHI was 53.2 per hour, with a central apnea index of 0 per hour. There is a significant positional component with supine sleep AHI 68.3/h versus non-supine sleep AHI 8.5/h.  The oxygen nadir was 83% during sleep.  CARDIAC DATA Mean heart rate during sleep was 66.0 bpm. Heart rate range was 56 - 153 bpm (? accuracy of upper rate)  IMPRESSIONS - Severe obstructive sleep apnea occurred during this study (AHI 53.2/h). - Moderate oxygen desaturation to a naidir of 83%. - Patient snored for 44.2 minutes (11.3%) during the sleep.  DIAGNOSIS -  Obstructive Sleep Apnea (G47.33)  RECOMMENDATIONS - In this patient with cardiovascular comorbidities and severe sleep apnea recommend an in-lab CPAP titration evaluation; if unable, initiate Auto-PAP with EPR of 3 at  6 - 20 cm of water. - Effort shjould be made to optimize nasal and oropharyngeal patency. - Positional therapy avoiding supine position during sleep. - Avoid alcohol, sedatives and other CNS depressants that may worsen sleep apnea and disrupt normal sleep architecture. - Sleep hygiene should be reviewed to assess factors that may improve sleep quality. - Weight management (BMI 31) and regular exercise should be initiated or continued. - Recommend a download and sleep clinic evaluation 4 weeks after initiaion of therapy.   [Electronically signed] 01/14/2022 11:35 AM  Nicki Guadalajara MD, [facility], ABSM Diplomate, American Board of Sleep Medicine  NPI: [id number]  [facility] PH: [phone]   FX: [phone] ACCREDITED BY THE AMERICAN ACADEMY OF SLEEP MEDICINE

## 2022-01-15 ENCOUNTER — Telehealth: Payer: Self-pay | Admitting: *Deleted

## 2022-01-15 ENCOUNTER — Other Ambulatory Visit: Payer: Self-pay | Admitting: Cardiovascular Disease

## 2022-01-15 DIAGNOSIS — G4733 Obstructive sleep apnea (adult) (pediatric): Secondary | ICD-10-CM

## 2022-01-15 DIAGNOSIS — G4736 Sleep related hypoventilation in conditions classified elsewhere: Secondary | ICD-10-CM

## 2022-01-15 NOTE — Telephone Encounter (Signed)
-----   Message from Lennette Bihari, MD sent at 01/14/2022 11:39 AM EST ----- Burna Mortimer, please notify pt of results; schedule for in-lab CPAP titration; if unable, then Auto-PAP

## 2022-01-15 NOTE — Telephone Encounter (Signed)
Message left to return a call to discuss sleep study results. 

## 2022-01-15 NOTE — Telephone Encounter (Signed)
Patient returned a call and was given sleep study results and recommendations. He agrees to proceed with CPAP titration pending insurance approval.

## 2022-01-23 ENCOUNTER — Telehealth: Payer: Self-pay | Admitting: *Deleted

## 2022-01-23 NOTE — Telephone Encounter (Signed)
Prior Authorization for split night sent to [employer] via web portal. Per [employer] portal. Sleep PA is handled by [employer]. Per [employer] portal no PA is required.

## 2022-01-29 NOTE — Progress Notes (Signed)
Cardiology Office Note:    Date:  01/30/2022   ID:  Eric Bauer, DOB 1966-01-14, MRN [id number]  PCP:  Eric Pila, FNP [facility] Cardiologist: Eric Millers, MD   Reason for visit: 2 month follow-up  History of Present Illness:    Eric Bauer is a 56 y.o. male with a hx of CAD, diabetes, hypertension & hyperlipidemia.  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 FFR showed significant stenosis in the distal LAD.  Cardiac catheterization June 2022 showed 40% proximal LAD.    I last saw him September 2023 following ER visit for sharp left chest pain rating to left arm.  He had a negative stress echo.  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  He would feel dyspneic while walking the grocery store.  He also complained of left-sided chest pain which was symptoms worse with heavy lifting.  We discussed worsening LDL to 217 after he had issues getting his medications.  I referred him for sleep study with a STOP-BANG score of 7 - found with severe sleep apnea.  Today, patient states he is waiting on insurance approval for CPAP.  Since his last visit and starting Imdur, he states his dyspnea has improved and is now rare.  He denies chest pain.  He mentions he had migraines when he for started Imdur but that has since resolved.  He states his diabetes seems to be well-controlled and sees his PCP today.  He denies lightheadedness.    Past Medical History:  Diagnosis Date   Diabetes mellitus (HCC)    Hyperlipidemia    Hypertension     Past Surgical History:  Procedure Laterality Date   No prior surgery     RIGHT HEART CATH AND CORONARY ANGIOGRAPHY N/A 08/15/2020   Procedure: RIGHT HEART CATH AND CORONARY ANGIOGRAPHY;  Surgeon: Eric Gess, MD;  Location: [facility];  Service:  Cardiovascular;  Laterality: N/A;    Current Medications: Current Meds  Medication Sig   aspirin EC 81 MG EC tablet Take 1 tablet (81 mg total) by mouth daily. Swallow whole.   famotidine (PEPCID) 40 MG tablet Take 40 mg by mouth at bedtime.   isosorbide mononitrate (IMDUR) 30 MG 24 hr tablet Take 0.5 tablets (15 mg total) by mouth daily.   JANUMET 50-1000 MG tablet Take 1 tablet by mouth 2 (two) times daily with a meal.   lisinopril (ZESTRIL) 5 MG tablet Take 0.5 tablets (2.5 mg total) by mouth at bedtime.   metoprolol succinate (TOPROL XL) 25 MG 24 hr tablet Take 1 tablet (25 mg total) by mouth daily.   omeprazole (PRILOSEC) 20 MG capsule Take by mouth.   oxybutynin (DITROPAN-XL) 5 MG 24 hr tablet Take 5 mg by mouth daily.   rosuvastatin (CRESTOR) 40 MG tablet Take 40 mg by mouth at bedtime.   tamsulosin (FLOMAX) 0.4 MG CAPS capsule Take 0.8 mg by mouth at bedtime.   TRESIBA FLEXTOUCH 100 UNIT/ML FlexTouch Pen Inject 46 Units into the skin at bedtime.     Allergies:   Patient has no known allergies.   Social History   Socioeconomic History   Marital status: Married    Spouse name: Not on file   Number of children: 5   Years of education: Not on file   Highest education  level: Not on file  Occupational History   Not on file  Tobacco Use   Smoking status: Never   Smokeless tobacco: Never  Substance and Sexual Activity   Alcohol use: Not Currently   Drug use: Not on file   Sexual activity: Not on file  Other Topics Concern   Not on file  Social History Narrative   Not on file   Social Determinants of Health   Financial Resource Strain: Not on file  Food Insecurity: Not on file  Transportation Needs: Not on file  Physical Activity: Not on file  Stress: Not on file  Social Connections: Not on file     Family History: The patient's family history includes Heart attack in his father; Liver cancer in his mother.  ROS:   Please see the history of present illness.      EKGs/Labs/Other Studies Reviewed:    Recent Labs: No results found for requested labs within last 365 days.   Recent Lipid Panel Lab Results  Component Value Date/Time   CHOL 93 06/18/2020 04:25 AM   TRIG 62 06/18/2020 04:25 AM   HDL 34 (L) 06/18/2020 04:25 AM   LDLCALC 47 06/18/2020 04:25 AM    Physical Exam:    VS:  BP 116/80   Pulse 70   Ht 5\' 8"  (1.727 m)   Wt 203 lb 12.8 oz (92.4 kg)   SpO2 98%   BMI 30.99 kg/m    No data found.       Wt Readings from Last 3 Encounters:  01/30/22 203 lb 12.8 oz (92.4 kg)  12/26/21 205 lb (93 kg)  11/21/21 205 lb (93 kg)     GEN:  Well nourished, well developed in no acute distress HEENT: Normal NECK: No JVD; No carotid bruits CARDIAC: RRR, no murmurs, rubs, gallops RESPIRATORY:  Clear to auscultation without rales, wheezing or rhonchi  ABDOMEN: Soft, non-tender, non-distended MUSCULOSKELETAL: No edema SKIN: Warm and dry NEUROLOGIC:  Alert and oriented PSYCHIATRIC:  Normal affect     ASSESSMENT AND PLAN   Coronary artery disease without angina -Cardiac CTA 6/22 showed Ca score 1693; moderate stenosis in the proximal and mid LAD and severe stenosis in the distal LAD.  FFR showed significant stenosis in the distal LAD.  Cardiac catheterization June 2022 showed 40% proximal LAD.   -Negative stress echo 10/2021 at [facility] -Continue Imdur 15 mg daily -Continue Toprol 25 mg daily, aspirin 81 mg and Crestor -Continue diabetic control with last A1c 6.4% in August 2023.   Sleep apnea -Follow-up on insurance approval for CPAP -Patient is hopeful this will improve his daytime fatigue   Dyspnea on exertion, improved -Patient had normal PFTs in August 2022 with [facility] -No tobacco use, asthma and COPD. -Improved since starting Imdur   Hypertension, well controlled -Medications as above. -Goal BP is <130/80.     Hyperlipidemia with goal LDL less than 70 -LDL 217 in August 2023 when off Crestor.  LDL 48 in  August 2022 while on Crestor. -Continue Crestor 40 mg daily  -Check fasting lipids today.   Disposition - Follow-up in 6 months with Dr. September 2022 or myself.   Medication Adjustments/Labs and Tests Ordered: Current medicines are reviewed at length with the patient today.  Concerns regarding medicines are outlined above.  Orders Placed This Encounter  Procedures   Lipid panel   No orders of the defined types were placed in this encounter.   Patient Instructions  Medication Instructions:  Your physician recommends  that you continue on your current medications as directed. Please refer to the Current Medication list given to you today.   *If you need a refill on your cardiac medications before your next appointment, please call your pharmacy*   Lab Work: Your physician recommends that you complete lab work today Lipid panel  If you have labs (blood work) drawn today and your tests are completely normal, you will receive your results only by: MyChart Message (if you have MyChart) OR A paper copy in the mail If you have any lab test that is abnormal or we need to change your treatment, we will call you to review the results.   Testing/Procedures: NONE ordered at this time of appointment     Follow-Up: At [facility], you and your health needs are our priority.  As part of our continuing mission to provide you with exceptional heart care, we have created designated Provider Care Teams.  These Care Teams include your primary Cardiologist (physician) and Advanced Practice Providers (APPs -  Physician Assistants and Nurse Practitioners) who all work together to provide you with the care you need, when you need it.  We recommend signing up for the patient portal called "MyChart".  Sign up information is provided on this After Visit Summary.  MyChart is used to connect with patients for Virtual Visits (Telemedicine).  Patients are able to view lab/test results, encounter notes,  upcoming appointments, etc.  Non-urgent messages can be sent to your provider as well.   To learn more about what you can do with MyChart, go to [email or web address].    Your next appointment:   6 month(s)  The format for your next appointment:   In Person  Provider:   Olga Millers, MD  or Juanda Crumble, PA-C        Other Instructions   Important Information About Sugar         Signed, Eric Kettle, PA-C  01/30/2022 9:02 AM    [facility]

## 2022-01-30 ENCOUNTER — Encounter: Payer: Self-pay | Admitting: Physician Assistant

## 2022-01-30 ENCOUNTER — Ambulatory Visit: Payer: BC Managed Care – PPO | Attending: Physician Assistant | Admitting: Physician Assistant

## 2022-01-30 VITALS — BP 116/80 | HR 70 | Ht 68.0 in | Wt 203.8 lb

## 2022-01-30 DIAGNOSIS — R072 Precordial pain: Secondary | ICD-10-CM

## 2022-01-30 DIAGNOSIS — E7800 Pure hypercholesterolemia, unspecified: Secondary | ICD-10-CM

## 2022-01-30 DIAGNOSIS — I2510 Atherosclerotic heart disease of native coronary artery without angina pectoris: Secondary | ICD-10-CM

## 2022-01-30 DIAGNOSIS — E1165 Type 2 diabetes mellitus with hyperglycemia: Secondary | ICD-10-CM | POA: Diagnosis not present

## 2022-01-30 DIAGNOSIS — E6609 Other obesity due to excess calories: Secondary | ICD-10-CM | POA: Diagnosis not present

## 2022-01-30 DIAGNOSIS — G4733 Obstructive sleep apnea (adult) (pediatric): Secondary | ICD-10-CM | POA: Diagnosis not present

## 2022-01-30 DIAGNOSIS — E1169 Type 2 diabetes mellitus with other specified complication: Secondary | ICD-10-CM | POA: Diagnosis not present

## 2022-01-30 DIAGNOSIS — E559 Vitamin D deficiency, unspecified: Secondary | ICD-10-CM | POA: Diagnosis not present

## 2022-01-30 LAB — LIPID PANEL
Chol/HDL Ratio: 4.3 ratio (ref 0.0–5.0)
Cholesterol, Total: 152 mg/dL (ref 100–199)
HDL: 35 mg/dL — ABNORMAL LOW (ref >39)
LDL Chol Calc (NIH): 89 mg/dL (ref 0–99)
Triglycerides: 162 mg/dL — ABNORMAL HIGH (ref 0–149)
VLDL Cholesterol Cal: 28 mg/dL (ref 5–40)

## 2022-01-30 NOTE — Patient Instructions (Signed)
Medication Instructions:  Your physician recommends that you continue on your current medications as directed. Please refer to the Current Medication list given to you today.   *If you need a refill on your cardiac medications before your next appointment, please call your pharmacy*   Lab Work: Your physician recommends that you complete lab work today Lipid panel  If you have labs (blood work) drawn today and your tests are completely normal, you will receive your results only by: MyChart Message (if you have MyChart) OR A paper copy in the mail If you have any lab test that is abnormal or we need to change your treatment, we will call you to review the results.   Testing/Procedures: NONE ordered at this time of appointment     Follow-Up: At [facility], you and your health needs are our priority.  As part of our continuing mission to provide you with exceptional heart care, we have created designated Provider Care Teams.  These Care Teams include your primary Cardiologist (physician) and Advanced Practice Providers (APPs -  Physician Assistants and Nurse Practitioners) who all work together to provide you with the care you need, when you need it.  We recommend signing up for the patient portal called "MyChart".  Sign up information is provided on this After Visit Summary.  MyChart is used to connect with patients for Virtual Visits (Telemedicine).  Patients are able to view lab/test results, encounter notes, upcoming appointments, etc.  Non-urgent messages can be sent to your provider as well.   To learn more about what you can do with MyChart, go to [email or web address].    Your next appointment:   6 month(s)  The format for your next appointment:   In Person  Provider:   Olga Millers, MD  or Juanda Crumble, PA-C        Other Instructions   Important Information About Sugar

## 2022-01-31 DIAGNOSIS — E119 Type 2 diabetes mellitus without complications: Secondary | ICD-10-CM | POA: Diagnosis not present

## 2022-02-11 ENCOUNTER — Telehealth: Payer: Self-pay

## 2022-02-11 NOTE — Telephone Encounter (Addendum)
Called patient regarding results. Left message for patient to call office.----- Message from Cannon Kettle, PA-C sent at 02/05/2022  9:20 PM EST ----- Your LDL has greatly improved since August but is still higher than goal/higher than it was in 05/2020.   LDL = 89.  Our goal is <70 with your known coronary disease. Your LDL was 47 in 05/2020 -- I know we can get it there again.  My recommendation:  Continue Crestor 40mg  & recheck fasting lipids in 6 months (before your follow-up appt) To help lower your bad cholesterol:  - Mediterranean diet, DASH diet, Vegetarian (or other meat restricted) diet, Low-carbohydrate diet.  Can lower LDL by 15-30%. " Avoid trans fats: ex. stick margarine, shortening, fried foods, cakes and pies " Nuts, high-fiber foods (oats, barley, lentil, split peas, beans, fruits and vegetables), and fiber supplements may be added to any diet to improve lipids.                                        Turn to back '  " Some healthier choices that help lower cholesterol. - Substituting a lean meat such as (non-fried) poultry or fish (other than shrimp) for red meat  - Replacing refined grain products with higher-fiber whole-grain products whenever possible - Drinking tea, carbonated water, or plain water instead of sugar-sweetened drinks/juices

## 2022-02-13 NOTE — Telephone Encounter (Signed)
Pt is returning call. Requesting call back.  

## 2022-02-21 ENCOUNTER — Telehealth: Payer: Self-pay

## 2022-02-21 NOTE — Telephone Encounter (Addendum)
Called patient regarding results. Left message for patient to call office.----- Message from Jennifer K Lambert, PA-C sent at 02/05/2022  9:20 PM EST ----- Your LDL has greatly improved since August but is still higher than goal/higher than it was in 05/2020.   LDL = 89.  Our goal is <70 with your known coronary disease. Your LDL was 47 in 05/2020 -- I know we can get it there again.  My recommendation:  Continue Crestor 40mg & recheck fasting lipids in 6 months (before your follow-up appt) To help lower your bad cholesterol:  - Mediterranean diet, DASH diet, Vegetarian (or other meat restricted) diet, Low-carbohydrate diet.  Can lower LDL by 15-30%. " Avoid trans fats: ex. stick margarine, shortening, fried foods, cakes and pies " Nuts, high-fiber foods (oats, barley, lentil, split peas, beans, fruits and vegetables), and fiber supplements may be added to any diet to improve lipids.                                        Turn to back '  " Some healthier choices that help lower cholesterol. - Substituting a lean meat such as (non-fried) poultry or fish (other than shrimp) for red meat  - Replacing refined grain products with higher-fiber whole-grain products whenever possible - Drinking tea, carbonated water, or plain water instead of sugar-sweetened drinks/juices  

## 2022-02-26 ENCOUNTER — Ambulatory Visit (HOSPITAL_BASED_OUTPATIENT_CLINIC_OR_DEPARTMENT_OTHER): Payer: BC Managed Care – PPO | Attending: Cardiovascular Disease | Admitting: Cardiovascular Disease

## 2022-02-26 VITALS — Ht 68.5 in | Wt 205.0 lb

## 2022-02-26 DIAGNOSIS — G4733 Obstructive sleep apnea (adult) (pediatric): Secondary | ICD-10-CM | POA: Diagnosis not present

## 2022-02-26 DIAGNOSIS — G4736 Sleep related hypoventilation in conditions classified elsewhere: Secondary | ICD-10-CM

## 2022-02-27 ENCOUNTER — Encounter (HOSPITAL_BASED_OUTPATIENT_CLINIC_OR_DEPARTMENT_OTHER): Payer: Self-pay | Admitting: Cardiovascular Disease

## 2022-02-27 NOTE — Procedures (Signed)
Patient Name: Kristin, Lamagna Date: 02/26/2022 Gender: Male D.O.B: 10/25/1965 Age (years): 56 Referring Provider: Caron Presume PA-C Height (inches): 69 Interpreting Physician: Shelva Majestic MD, ABSM Weight (lbs): 205 RPSGT: Carolin Coy BMI: 31 MRN: [id number] Neck Size: 15.50  CLINICAL INFORMATION The patient is referred for a CPAP titration to treat sleep apnea.  Date of HST: 01/05/2022: AHI 53.2/h; supine AHI 68.3/h, non-supine 8.5/h; O2 nadir 83%.  SLEEP STUDY TECHNIQUE As per the AASM Manual for the Scoring of Sleep and Associated Events v2.3 (April 2016) with a hypopnea requiring 4% desaturations.  The channels recorded and monitored were frontal, central and occipital EEG, electrooculogram (EOG), submentalis EMG (chin), nasal and oral airflow, thoracic and abdominal wall motion, anterior tibialis EMG, snore microphone, electrocardiogram, and pulse oximetry. Continuous positive airway pressure (CPAP) was initiated at the beginning of the study and titrated to treat sleep-disordered breathing.  MEDICATIONS aspirin EC 81 MG EC tablet famotidine (PEPCID) 40 MG tablet isosorbide mononitrate (IMDUR) 30 MG 24 hr tablet JANUMET 50-1000 MG tablet lisinopril (ZESTRIL) 5 MG tablet metoprolol succinate (TOPROL XL) 25 MG 24 hr tablet omeprazole (PRILOSEC) 20 MG capsule oxybutynin (DITROPAN-XL) 5 MG 24 hr tablet rosuvastatin (CRESTOR) 40 MG tablet tamsulosin (FLOMAX) 0.4 MG CAPS capsule TRESIBA FLEXTOUCH 100 UNIT/ML FlexTouch Pen Medications self-administered by patient taken the night of the study : CRESTOR, DITROPAN XL, FISH OIL, FLOMAX, janumet, METOPROLOL SUCCINATE, OMEPRAZOLE, tresiba flextouch  TECHNICIAN COMMENTS Comments added by technician: PATIENT WAS ORDERED AS A CPAP TITRATION. Comments added by scorer: N/A  RESPIRATORY PARAMETERS Optimal PAP Pressure (cm): 18 AHI at Optimal Pressure (/hr): 1.2 Overall Minimal O2 (%): 89.0 Supine % at Optimal  Pressure (%): 39 Minimal O2 at Optimal Pressure (%): 93.0   SLEEP ARCHITECTURE The study was initiated at 10:59:25 PM and ended at 5:01:03 AM.  Sleep onset time was 24.2 minutes and the sleep efficiency was 82.7%. The total sleep time was 299 minutes.  The patient spent 17.9% of the night in stage N1 sleep, 76.9% in stage N2 sleep, 0.0% in stage N3 and 5.2% in REM.Stage REM latency was 161.0 minutes  Wake after sleep onset was 38.4. Alpha intrusion was absent. Supine sleep was 64.05%.  CARDIAC DATA The 2 lead EKG demonstrated sinus rhythm. The mean heart rate was 59.1 beats per minute. Other EKG findings include: None.  LEG MOVEMENT DATA The total Periodic Limb Movements of Sleep (PLMS) were 0. The PLMS index was 0.0. A PLMS index of <15 is considered normal in adults.  IMPRESSIONS - CPAP was initiated at 6 cm and was titrated to optimal PAP pressure at 18 cm of water (AHI 1.2/h; RDI 9.8/h; O2 nadir 93%; REM sleep was not achieved at this pressure). - Central sleep apnea was not noted during this titration (CAI 2/h). - Mild oxygen desaturations were observed during this titration to a nadir of 89.0% at 8 cm. - No snoring was audible during this study. - Mild cardiac abnormalities were observed during this stud: occasional PACs - Clinically significant periodic limb movements were not noted during this study. Arousals associated with PLMs were rare.  DIAGNOSIS - Obstructive Sleep Apnea (G47.33)  RECOMMENDATIONS - Recommend an initial trial of CPAP Auto therapy with EPR of 3 at 16 - 20 cm H2O with heated humidification. A Medium size Resmed Full Face AirFit F30i mask was used for the titration. - Effort should be made to optimize nasal and oropharyngeal patency. - Avoid alcohol, sedatives and other CNS depressants that may  worsen sleep apnea and disrupt normal sleep architecture. - Sleep hygiene should be reviewed to assess factors that may improve sleep quality. - Weight management  (BMI 31) and regular exercise should be initiated or continued. - Return to Sleep Center for re-evaluation after 4 weeks of therapy   [Electronically signed] 02/27/2022 01:58 PM  Shelva Majestic MD, [facility], ABSM Diplomate, American Board of Sleep Medicine  NPI: [id number]  [place] PH: [phone]   FX: [phone] [place]

## 2022-03-01 ENCOUNTER — Telehealth: Payer: Self-pay | Admitting: *Deleted

## 2022-03-01 NOTE — Telephone Encounter (Signed)
-----   Message from Troy Sine, MD sent at 02/27/2022  2:04 PM EST ----- Mariann Laster, please notify pt and set up with DME for CPAP initiation.

## 2022-03-01 NOTE — Telephone Encounter (Signed)
Left message sleep study has been completed. CPAP order will be sent to [place] via [employer] portal.

## 2022-03-08 DIAGNOSIS — F411 Generalized anxiety disorder: Secondary | ICD-10-CM | POA: Diagnosis not present

## 2022-03-08 DIAGNOSIS — R350 Frequency of micturition: Secondary | ICD-10-CM | POA: Diagnosis not present

## 2022-03-08 DIAGNOSIS — N401 Enlarged prostate with lower urinary tract symptoms: Secondary | ICD-10-CM | POA: Diagnosis not present

## 2022-04-03 ENCOUNTER — Telehealth: Payer: Self-pay

## 2022-04-03 NOTE — Telephone Encounter (Signed)
Called patient regarding results. Left message forpatient to call office. Letter mailed 04/03/22

## 2022-05-27 DIAGNOSIS — E785 Hyperlipidemia, unspecified: Secondary | ICD-10-CM | POA: Diagnosis not present

## 2022-05-27 DIAGNOSIS — E538 Deficiency of other specified B group vitamins: Secondary | ICD-10-CM | POA: Diagnosis not present

## 2022-05-27 DIAGNOSIS — E1169 Type 2 diabetes mellitus with other specified complication: Secondary | ICD-10-CM | POA: Diagnosis not present

## 2022-05-27 DIAGNOSIS — K219 Gastro-esophageal reflux disease without esophagitis: Secondary | ICD-10-CM | POA: Diagnosis not present

## 2022-05-27 DIAGNOSIS — E1165 Type 2 diabetes mellitus with hyperglycemia: Secondary | ICD-10-CM | POA: Diagnosis not present

## 2022-05-27 DIAGNOSIS — Z794 Long term (current) use of insulin: Secondary | ICD-10-CM | POA: Diagnosis not present

## 2022-05-27 DIAGNOSIS — E559 Vitamin D deficiency, unspecified: Secondary | ICD-10-CM | POA: Diagnosis not present

## 2022-06-07 DIAGNOSIS — K760 Fatty (change of) liver, not elsewhere classified: Secondary | ICD-10-CM | POA: Diagnosis not present

## 2022-06-07 DIAGNOSIS — Z23 Encounter for immunization: Secondary | ICD-10-CM | POA: Diagnosis not present

## 2022-06-07 DIAGNOSIS — E6609 Other obesity due to excess calories: Secondary | ICD-10-CM | POA: Diagnosis not present

## 2022-06-07 DIAGNOSIS — K219 Gastro-esophageal reflux disease without esophagitis: Secondary | ICD-10-CM | POA: Diagnosis not present

## 2022-06-07 DIAGNOSIS — E1165 Type 2 diabetes mellitus with hyperglycemia: Secondary | ICD-10-CM | POA: Diagnosis not present

## 2022-06-20 ENCOUNTER — Telehealth: Payer: Self-pay | Admitting: Cardiology

## 2022-06-20 DIAGNOSIS — R0789 Other chest pain: Secondary | ICD-10-CM | POA: Diagnosis not present

## 2022-06-20 DIAGNOSIS — Z7982 Long term (current) use of aspirin: Secondary | ICD-10-CM | POA: Diagnosis not present

## 2022-06-20 DIAGNOSIS — I10 Essential (primary) hypertension: Secondary | ICD-10-CM | POA: Diagnosis not present

## 2022-06-20 DIAGNOSIS — Z794 Long term (current) use of insulin: Secondary | ICD-10-CM | POA: Diagnosis not present

## 2022-06-20 DIAGNOSIS — E1165 Type 2 diabetes mellitus with hyperglycemia: Secondary | ICD-10-CM | POA: Diagnosis not present

## 2022-06-20 DIAGNOSIS — R079 Chest pain, unspecified: Secondary | ICD-10-CM | POA: Diagnosis not present

## 2022-06-20 DIAGNOSIS — E1169 Type 2 diabetes mellitus with other specified complication: Secondary | ICD-10-CM | POA: Diagnosis not present

## 2022-06-20 DIAGNOSIS — Z7984 Long term (current) use of oral hypoglycemic drugs: Secondary | ICD-10-CM | POA: Diagnosis not present

## 2022-06-20 DIAGNOSIS — I2510 Atherosclerotic heart disease of native coronary artery without angina pectoris: Secondary | ICD-10-CM | POA: Diagnosis not present

## 2022-06-20 DIAGNOSIS — R071 Chest pain on breathing: Secondary | ICD-10-CM | POA: Diagnosis not present

## 2022-06-20 DIAGNOSIS — K760 Fatty (change of) liver, not elsewhere classified: Secondary | ICD-10-CM | POA: Diagnosis not present

## 2022-06-20 DIAGNOSIS — R9431 Abnormal electrocardiogram [ECG] [EKG]: Secondary | ICD-10-CM | POA: Diagnosis not present

## 2022-06-20 DIAGNOSIS — E785 Hyperlipidemia, unspecified: Secondary | ICD-10-CM | POA: Diagnosis not present

## 2022-06-20 NOTE — Telephone Encounter (Signed)
Pt c/o Shortness Of Breath: STAT if SOB developed within the last 24 hours or pt is noticeably SOB on the phone  1. Are you currently SOB (can you hear that pt is SOB on the phone)?  Patient says he is SOB, but does not sound SOB on the phone  2. How long have you been experiencing SOB?  Started this week   3. Are you SOB when sitting or when up moving around?  Both   4. Are you currently experiencing any other symptoms?  Lightheadedness and chest tightness

## 2022-06-20 NOTE — Telephone Encounter (Signed)
Shortness of breath started this week; light-headedness and chest tightness. Says feels like something is sitting on his chest, he reports some nausea, feels exhausted. He says he feels the same way he did before he had his heart cath in 2022. He does not have NTG  I told him that he needs to go to the ER to be evaluated. He can follow up with [place] after that.   He verbalized understanding.

## 2022-06-21 DIAGNOSIS — R7989 Other specified abnormal findings of blood chemistry: Secondary | ICD-10-CM | POA: Diagnosis not present

## 2022-06-21 DIAGNOSIS — I77819 Aortic ectasia, unspecified site: Secondary | ICD-10-CM | POA: Diagnosis not present

## 2022-06-21 DIAGNOSIS — R079 Chest pain, unspecified: Secondary | ICD-10-CM | POA: Diagnosis not present

## 2022-06-21 DIAGNOSIS — I517 Cardiomegaly: Secondary | ICD-10-CM | POA: Diagnosis not present

## 2022-06-21 DIAGNOSIS — I2510 Atherosclerotic heart disease of native coronary artery without angina pectoris: Secondary | ICD-10-CM | POA: Diagnosis not present

## 2022-06-21 DIAGNOSIS — I378 Other nonrheumatic pulmonary valve disorders: Secondary | ICD-10-CM | POA: Diagnosis not present

## 2022-06-21 DIAGNOSIS — R931 Abnormal findings on diagnostic imaging of heart and coronary circulation: Secondary | ICD-10-CM | POA: Diagnosis not present

## 2022-06-24 NOTE — Progress Notes (Unsigned)
HPI: FU CAD and presyncope. Patient previously seen in [facility] by Jeralene Huff MD.  There was a question of atrial septal defect.  However transesophageal echocardiogram December 2019 showed normal LV function, mild right ventricular enlargement, no atrial septal defect or other congenital abnormalities and no valvular heart disease noted. Monitor 6/22 showed sinus with rare PVC. 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 FFR showed significant stenosis in the distal LAD.  Cardiac catheterization June 2022 showed 40% proximal LAD.  Stress echocardiogram September 2023 negative.  Recently admitted to [facility] with chest pain.  Echocardiogram April 2024 showed normal LV function, mild left ventricular hypertrophy, mild right ventricular enlargement, trace aortic and mitral regurgitation, dilated ascending aorta at 4.1 cm, density on the pulmonic valve felt to be dysplastic pulmonic valve versus vegetation versus thrombus; apparently images were reviewed by cardiology at [facility] and no further workup recommended.  Follow-up CTA April 2024 showed no pulmonary embolus, stable dilatation of the main pulmonary artery compatible with pulmonary hypertension, coronary artery disease.  Note PA pressure at time of right heart catheterization June 2022 16/6.  Since last seen, patient does note dyspnea.  It occurs both with activities and at rest.  No orthopnea, PND, pedal edema.  He continues to have occasional chest pressure which is chronic and unchanged.  He has had problems with dizziness.  He has not had syncope.  Current Outpatient Medications  Medication Sig Dispense Refill   aspirin EC 81 MG EC tablet Take 1 tablet (81 mg total) by mouth daily. Swallow whole. 30 tablet 11   escitalopram (LEXAPRO) 20 MG tablet Take by mouth.     famotidine (PEPCID) 40 MG tablet Take 40 mg by mouth at bedtime.      isosorbide mononitrate (IMDUR) 30 MG 24 hr tablet Take 0.5 tablets (15 mg total) by mouth daily. 45 tablet 3   lisinopril (ZESTRIL) 5 MG tablet Take 0.5 tablets (2.5 mg total) by mouth at bedtime. 45 tablet 3   metFORMIN (GLUCOPHAGE) 500 MG tablet Take by mouth.     metoprolol succinate (TOPROL XL) 25 MG 24 hr tablet Take 1 tablet (25 mg total) by mouth daily. 90 tablet 3   omeprazole (PRILOSEC) 20 MG capsule Take by mouth.     oxybutynin (DITROPAN-XL) 5 MG 24 hr tablet Take 5 mg by mouth daily.     rosuvastatin (CRESTOR) 40 MG tablet Take 40 mg by mouth at bedtime.     tamsulosin (FLOMAX) 0.4 MG CAPS capsule Take 0.8 mg by mouth at bedtime.     TRESIBA FLEXTOUCH 100 UNIT/ML FlexTouch Pen Inject 46 Units into the skin at bedtime.     No current facility-administered medications for this visit.     Past Medical History:  Diagnosis Date   Diabetes mellitus (HCC)    Hyperlipidemia    Hypertension     Past Surgical History:  Procedure Laterality Date   No prior surgery     RIGHT HEART CATH AND CORONARY ANGIOGRAPHY N/A 08/15/2020   Procedure: RIGHT HEART CATH AND CORONARY ANGIOGRAPHY;  Surgeon: Runell Gess, MD;  Location: [facility] INVASIVE CV LAB;  Service: Cardiovascular;  Laterality: N/A;    Social History   Socioeconomic History   Marital status: Married    Spouse name: Not on file   Number of children: 5   Years of education: Not on  file   Highest education level: Not on file  Occupational History   Not on file  Tobacco Use   Smoking status: Never   Smokeless tobacco: Never  Substance and Sexual Activity   Alcohol use: Not Currently   Drug use: Not on file   Sexual activity: Not on file  Other Topics Concern   Not on file  Social History Narrative   Not on file   Social Determinants of Health   Financial Resource Strain: Not on file  Food Insecurity: Not on file  Transportation Needs: Not on file  Physical Activity: Not on file  Stress: Not on file  Social  Connections: Not on file  Intimate Partner Violence: Not on file    Family History  Problem Relation Age of Onset   Liver cancer Mother    Heart attack Father     ROS: no fevers or chills, productive cough, hemoptysis, dysphasia, odynophagia, melena, hematochezia, dysuria, hematuria, rash, seizure activity, orthopnea, PND, pedal edema, claudication. Remaining systems are negative.  Physical Exam: Well-developed well-nourished in no acute distress.  Skin is warm and dry.  HEENT is normal.  Neck is supple.  Chest is clear to auscultation with normal expansion.  Cardiovascular exam is regular rate and rhythm.  Abdominal exam nontender or distended. No masses palpated. Extremities show no edema. neuro grossly intact  ECG-normal sinus rhythm at a rate of 78, no ST changes.  Personally reviewed  A/P  1 coronary artery disease-plan to continue aspirin and statin.  2 recent admission with chest pain-previous catheterization revealed mild coronary disease.  Follow-up stress echocardiogram at outlying facility normal.  Recent troponins normal.  Will not pursue further evaluation at this point.  3 hyperlipidemia-continue statin.  4 dilated pulmonary artery-this was noted on prior CTA suggestive of pulmonary hypertension.  However follow-up catheterization did not reveal elevated pulmonary pressures and previous TEE showed no ASD.  5 history of presyncope-neurology evaluated and felt previous episodes likely vagal in etiology.  No recurrences.  6 sleep apnea-patient has not been treated for his sleep apnea.  I will ask pulmonary to assess.  Can also evaluate his enlarged pulmonary artery.  Approximately 15 minutes spent reviewing outpatient records prior to patient arrival.  Olga Millers, MD

## 2022-06-25 ENCOUNTER — Ambulatory Visit: Payer: BC Managed Care – PPO | Attending: Cardiology | Admitting: Cardiology

## 2022-06-25 ENCOUNTER — Encounter: Payer: Self-pay | Admitting: Cardiology

## 2022-06-25 VITALS — BP 138/90 | HR 78 | Ht 68.5 in | Wt 199.4 lb

## 2022-06-25 DIAGNOSIS — I2510 Atherosclerotic heart disease of native coronary artery without angina pectoris: Secondary | ICD-10-CM | POA: Diagnosis not present

## 2022-06-25 DIAGNOSIS — R072 Precordial pain: Secondary | ICD-10-CM | POA: Diagnosis not present

## 2022-06-25 DIAGNOSIS — E7800 Pure hypercholesterolemia, unspecified: Secondary | ICD-10-CM | POA: Diagnosis not present

## 2022-06-25 DIAGNOSIS — G4733 Obstructive sleep apnea (adult) (pediatric): Secondary | ICD-10-CM | POA: Diagnosis not present

## 2022-06-25 MED ORDER — LISINOPRIL 10 MG PO TABS
10.0000 mg | ORAL_TABLET | Freq: Every day | ORAL | 3 refills | Status: AC
Start: 2022-06-25 — End: ?

## 2022-06-25 NOTE — Patient Instructions (Signed)
Medication Instructions:   INCREASE LISINOPRIL TO 10 MG ONCE DAILT= 2 OF THE 5 MG TABLETS ONCE DAILY  *If you need a refill on your cardiac medications before your next appointment, please call your pharmacy*   Lab Work: Your physician recommends that you return for lab work in: ONE WEEK- DO NOT NEED TO FAST  If you have labs (blood work) drawn today and your tests are completely normal, you will receive your results only by: MyChart Message (if you have MyChart) OR A paper copy in the mail If you have any lab test that is abnormal or we need to change your treatment, we will call you to review the results.     Follow-Up: At [facility], you and your health needs are our priority.  As part of our continuing mission to provide you with exceptional heart care, we have created designated Provider Care Teams.  These Care Teams include your primary Cardiologist (physician) and Advanced Practice Providers (APPs -  Physician Assistants and Nurse Practitioners) who all work together to provide you with the care you need, when you need it.  We recommend signing up for the patient portal called "MyChart".  Sign up information is provided on this After Visit Summary.  MyChart is used to connect with patients for Virtual Visits (Telemedicine).  Patients are able to view lab/test results, encounter notes, upcoming appointments, etc.  Non-urgent messages can be sent to your provider as well.   To learn more about what you can do with MyChart, go to [email or web address].    Your next appointment:   4 month(s)  Provider:   ANY APP

## 2022-06-26 ENCOUNTER — Encounter: Payer: Self-pay | Admitting: *Deleted

## 2022-06-26 ENCOUNTER — Telehealth: Payer: Self-pay | Admitting: Cardiology

## 2022-06-26 NOTE — Telephone Encounter (Signed)
Patient is returning call.  °

## 2022-06-26 NOTE — Telephone Encounter (Signed)
Left message for pt to call, does he want to pick up the letter, mail or fax number to send to? Letter generated.

## 2022-06-26 NOTE — Telephone Encounter (Signed)
Patient is calling stating his employer is needing a fit for duty note. He is requesting to pick this up tomorrow. Please advise.

## 2022-06-26 NOTE — Telephone Encounter (Signed)
Returned call to patient who states that he needs a note to return to work that states he has no restrictions.   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   Will forward to MD to see if letter is acceptable.

## 2022-06-26 NOTE — Telephone Encounter (Signed)
Attempted to call patient, left message for patient to call back to office.   

## 2022-06-27 NOTE — Telephone Encounter (Signed)
Note placed at the font desk for pick up.

## 2022-06-27 NOTE — Telephone Encounter (Signed)
Left message for pt to call.

## 2022-06-27 NOTE — Telephone Encounter (Signed)
Patient is returning phone call. Patient stated that they will come and pick up the letter tomorrow at the front desk.

## 2022-07-02 ENCOUNTER — Ambulatory Visit: Payer: BC Managed Care – PPO | Admitting: Internal Medicine

## 2022-07-02 ENCOUNTER — Encounter: Payer: Self-pay | Admitting: Internal Medicine

## 2022-07-02 VITALS — BP 122/64 | HR 70 | Ht 68.5 in | Wt 205.0 lb

## 2022-07-02 DIAGNOSIS — R0609 Other forms of dyspnea: Secondary | ICD-10-CM | POA: Insufficient documentation

## 2022-07-02 DIAGNOSIS — G4733 Obstructive sleep apnea (adult) (pediatric): Secondary | ICD-10-CM | POA: Diagnosis not present

## 2022-07-02 DIAGNOSIS — I2510 Atherosclerotic heart disease of native coronary artery without angina pectoris: Secondary | ICD-10-CM | POA: Diagnosis not present

## 2022-07-02 DIAGNOSIS — I200 Unstable angina: Secondary | ICD-10-CM | POA: Diagnosis not present

## 2022-07-02 NOTE — Patient Instructions (Signed)
Order- [facility] new DME, new CPAP auto 10-20, mask of choice, humidifier, supplies, AirView/ card  Note to [facility]- Patient couldn't afford co-pay upfront to start CPAP with [employer] ordered by cardiology  Order- PFT    dx dyspnea on exertion

## 2022-07-02 NOTE — Assessment & Plan Note (Signed)
Never smoker without a history of lung disease and chest sounds clear on exam today.  His dyspnea may not be attributable to pulmonary status. Plan-schedule PFT

## 2022-07-02 NOTE — Progress Notes (Signed)
07/01/22- 56 yoM never smoker for sleep evaluation courtesy of Dr Jens Som with concern of OSA Medical problem list includes hx Cerebral Infarction, CAD/Angina, HTN, DM2, Hyperlipidemia, HST 01/05/22- AHI 53.2/ hr, desaturation to 83%,  CPAP titration 02/26/22- to 18 cwp    recommended auto 16-20, EPR 3 Epworth score-18 Body weight today-205 lbs ------Had sleep study in November of 2023. Was unable to get a cpap machine due to insurance coverage. Patient states he snores a lot and several witnessed episodes of apnea I think what he is telling me is that he could not afford his co-pay to get started with CPAP.  We may be able to help him work out something with a DME company. He works in a warehouse-physical job-forklift and loading pallets.  He gets short of breath with activity including bending over but has little wheeze or cough.  We discussed getting a PFT to try to add some understanding to this problem.  Prior to Admission medications   Medication Sig Start Date End Date Taking? Authorizing Provider  aspirin EC 81 MG EC tablet Take 1 tablet (81 mg total) by mouth daily. Swallow whole. 06/20/20  Yes Kathlen Mody, MD  escitalopram (LEXAPRO) 20 MG tablet Take by mouth. 03/08/22  Yes [provider]  isosorbide mononitrate (IMDUR) 30 MG 24 hr tablet Take 0.5 tablets (15 mg total) by mouth daily. 11/21/21 11/16/22 Yes Juanda Crumble K, PA-C  lisinopril (ZESTRIL) 10 MG tablet Take 1 tablet (10 mg total) by mouth at bedtime. 06/25/22  Yes Lewayne Bunting, MD  metFORMIN (GLUCOPHAGE) 500 MG tablet Take by mouth. 06/07/22 06/07/23 Yes [provider]  metoprolol succinate (TOPROL XL) 25 MG 24 hr tablet Take 1 tablet (25 mg total) by mouth daily. 11/14/21  Yes Lewayne Bunting, MD  omeprazole (PRILOSEC) 20 MG capsule Take by mouth. 10/04/21  Yes [provider]  oxybutynin (DITROPAN-XL) 5 MG 24 hr tablet Take 5 mg by mouth daily. 11/01/21  Yes [provider]  rosuvastatin  (CRESTOR) 40 MG tablet Take 40 mg by mouth at bedtime. 06/09/20  Yes [provider]  tamsulosin (FLOMAX) 0.4 MG CAPS capsule Take 0.8 mg by mouth at bedtime. 06/09/20  Yes [provider]  TRESIBA FLEXTOUCH 100 UNIT/ML FlexTouch Pen Inject 46 Units into the skin at bedtime. 06/09/20  Yes [provider]  famotidine (PEPCID) 40 MG tablet Take 40 mg by mouth at bedtime. 06/09/20   [provider]   Past Medical History:  Diagnosis Date   Diabetes mellitus (HCC)    Hyperlipidemia    Hypertension    Past Surgical History:  Procedure Laterality Date   No prior surgery     RIGHT HEART CATH AND CORONARY ANGIOGRAPHY N/A 08/15/2020   Procedure: RIGHT HEART CATH AND CORONARY ANGIOGRAPHY;  Surgeon: Runell Gess, MD;  Location: [facility] INVASIVE CV LAB;  Service: Cardiovascular;  Laterality: N/A;   Family History  Problem Relation Age of Onset   Liver cancer Mother    Heart attack Father    Social History   Socioeconomic History   Marital status: Married    Spouse name: Not on file   Number of children: 5   Years of education: Not on file   Highest education level: Not on file  Occupational History   Not on file  Tobacco Use   Smoking status: Never   Smokeless tobacco: Never  Substance and Sexual Activity   Alcohol use: Not Currently   Drug use: Not on file  Sexual activity: Not on file  Other Topics Concern   Not on file  Social History Narrative   Not on file   Social Determinants of Health   Financial Resource Strain: Not on file  Food Insecurity: Not on file  Transportation Needs: Not on file  Physical Activity: Not on file  Stress: Not on file  Social Connections: Not on file  Intimate Partner Violence: Not on file   ROS-see HPI   + = positive Constitutional:    weight loss, night sweats, fevers, chills, fatigue, lassitude. HEENT:    headaches, difficulty swallowing, tooth/dental problems, sore throat,       sneezing, itching, ear  ache, nasal congestion, post nasal drip, snoring CV:    chest pain, orthopnea, PND, swelling in lower extremities, anasarca,                 dizziness, palpitations Resp:   +shortness of breath with exertion or at rest.                productive cough,   non-productive cough, coughing up of blood.              change in color of mucus.  wheezing.   Skin:    rash or lesions. GI:  No-   heartburn, indigestion, abdominal pain, nausea, vomiting, diarrhea,                 change in bowel habits, loss of appetite GU: dysuria, change in color of urine, no urgency or frequency.   flank pain. MS:   joint pain, stiffness, decreased range of motion, back pain. Neuro-     nothing unusual Psych:  change in mood or affect.  depression or anxiety.   memory loss.  OBJ- Physical Exam General- Alert, Oriented, Affect-appropriate, Distress- none acute Skin- rash-none, lesions- none, excoriation- none Lymphadenopathy- none Head- atraumatic            Eyes- Gross vision intact, PERRLA, conjunctivae and secretions clear            Ears- Hearing, canals-normal            Nose- Clear, no-Septal dev, mucus, polyps, erosion, perforation             Throat- Mallampati IV , mucosa clear , drainage- none, tonsils- atrophic, + teeth Neck- flexible , trachea midline, no stridor , thyroid nl, carotid no bruit Chest - symmetrical excursion , unlabored           Heart/CV- +RRR/ occ skip , murmur+1S , no gallop  , no rub, nl s1 s2                           - JVD- none , edema- none, stasis changes- none, varices- none           Lung- clear to P&A, wheeze- none, cough- none , dullness-none, rub- none           Chest wall-  Abd-  Br/ Gen/ Rectal- Not done, not indicated Extrem- cyanosis- none, clubbing, none, atrophy- none, strength- nl Neuro- grossly intact to observation

## 2022-07-02 NOTE — Assessment & Plan Note (Signed)
He has coronary artery disease with history of CVA, managed by cardiology

## 2022-07-02 NOTE — Assessment & Plan Note (Signed)
With his cardiovascular disease history CPAP is strongly recommended. Plan-we will ask our staff to work with DME companies to see if we can arrange for him to get a CPAP machine.  Anticipate auto 10-20

## 2022-07-03 ENCOUNTER — Encounter: Payer: Self-pay | Admitting: *Deleted

## 2022-07-03 LAB — BASIC METABOLIC PANEL
BUN/Creatinine Ratio: 13 (ref 9–20)
BUN: 11 mg/dL (ref 6–24)
CO2: 22 mmol/L (ref 20–29)
Calcium: 9.8 mg/dL (ref 8.7–10.2)
Chloride: 98 mmol/L (ref 96–106)
Creatinine, Ser: 0.85 mg/dL (ref 0.76–1.27)
Glucose: 148 mg/dL — ABNORMAL HIGH (ref 70–99)
Potassium: 4.8 mmol/L (ref 3.5–5.2)
Sodium: 137 mmol/L (ref 134–144)
eGFR: 103 mL/min/{1.73_m2} (ref >59)

## 2022-07-08 DIAGNOSIS — G4733 Obstructive sleep apnea (adult) (pediatric): Secondary | ICD-10-CM | POA: Diagnosis not present

## 2022-07-09 DIAGNOSIS — K824 Cholesterolosis of gallbladder: Secondary | ICD-10-CM | POA: Diagnosis not present

## 2022-07-09 DIAGNOSIS — K829 Disease of gallbladder, unspecified: Secondary | ICD-10-CM | POA: Diagnosis not present

## 2022-07-09 DIAGNOSIS — K760 Fatty (change of) liver, not elsewhere classified: Secondary | ICD-10-CM | POA: Diagnosis not present

## 2022-07-10 DIAGNOSIS — Z23 Encounter for immunization: Secondary | ICD-10-CM | POA: Diagnosis not present

## 2022-07-10 DIAGNOSIS — E1165 Type 2 diabetes mellitus with hyperglycemia: Secondary | ICD-10-CM | POA: Diagnosis not present

## 2022-07-10 DIAGNOSIS — K828 Other specified diseases of gallbladder: Secondary | ICD-10-CM | POA: Diagnosis not present

## 2022-07-10 DIAGNOSIS — K760 Fatty (change of) liver, not elsewhere classified: Secondary | ICD-10-CM | POA: Diagnosis not present

## 2022-07-10 DIAGNOSIS — K219 Gastro-esophageal reflux disease without esophagitis: Secondary | ICD-10-CM | POA: Diagnosis not present

## 2022-08-08 DIAGNOSIS — G4733 Obstructive sleep apnea (adult) (pediatric): Secondary | ICD-10-CM | POA: Diagnosis not present

## 2022-08-14 DIAGNOSIS — Z794 Long term (current) use of insulin: Secondary | ICD-10-CM | POA: Diagnosis not present

## 2022-08-14 DIAGNOSIS — E1165 Type 2 diabetes mellitus with hyperglycemia: Secondary | ICD-10-CM | POA: Diagnosis not present

## 2022-08-23 ENCOUNTER — Ambulatory Visit: Payer: BC Managed Care – PPO | Admitting: Cardiology

## 2022-09-07 DIAGNOSIS — G4733 Obstructive sleep apnea (adult) (pediatric): Secondary | ICD-10-CM | POA: Diagnosis not present

## 2022-09-12 DIAGNOSIS — Z794 Long term (current) use of insulin: Secondary | ICD-10-CM | POA: Diagnosis not present

## 2022-09-12 DIAGNOSIS — E1165 Type 2 diabetes mellitus with hyperglycemia: Secondary | ICD-10-CM | POA: Diagnosis not present

## 2022-09-29 NOTE — Progress Notes (Deleted)
07/01/22- 56 yoM never smoker for sleep evaluation courtesy of Dr Jens Som with concern of OSA Medical problem list includes hx Cerebral Infarction, CAD/Angina, HTN, DM2, Hyperlipidemia, HST 01/05/22- AHI 53.2/ hr, desaturation to 83%,  CPAP titration 02/26/22- to 18 cwp    recommended auto 16-20, EPR 3 Epworth score-18 Body weight today-205 lbs ------Had sleep study in November of 2023. Was unable to get a cpap machine due to insurance coverage. Patient states he snores a lot and several witnessed episodes of apnea I think what he is telling me is that he could not afford his co-pay to get started with CPAP.  We may be able to help him work out something with a DME company. He works in a warehouse-physical job-forklift and loading pallets.  He gets short of breath with activity including bending over but has little wheeze or cough.  We discussed getting a PFT to try to add some understanding to this problem.  10/01/22- 56 yoM never smoker followed for OSA, DOE, complicated by hx Cerebral Infarction, CAD/Angina, HTN, DM2, Hyperlipidemia, CPAP auto 10-20/ [employer]  ordered 07/02/22 Download compliance- Body weight today- PFT ordered 07/02/22 ? Not done  ROS-see HPI   + = positive Constitutional:    weight loss, night sweats, fevers, chills, fatigue, lassitude. HEENT:    headaches, difficulty swallowing, tooth/dental problems, sore throat,       sneezing, itching, ear ache, nasal congestion, post nasal drip, snoring CV:    chest pain, orthopnea, PND, swelling in lower extremities, anasarca,                  dizziness, palpitations Resp:   +shortness of breath with exertion or at rest.                productive cough,   non-productive cough, coughing up of blood.              change in color of mucus.  wheezing.   Skin:    rash or lesions. GI:  No-   heartburn, indigestion, abdominal pain, nausea, vomiting, diarrhea,                 change in bowel habits, loss of appetite GU: dysuria, change in color of  urine, no urgency or frequency.   flank pain. MS:   joint pain, stiffness, decreased range of motion, back pain. Neuro-     nothing unusual Psych:  change in mood or affect.  depression or anxiety.   memory loss.  OBJ- Physical Exam General- Alert, Oriented, Affect-appropriate, Distress- none acute Skin- rash-none, lesions- none, excoriation- none Lymphadenopathy- none Head- atraumatic            Eyes- Gross vision intact, PERRLA, conjunctivae and secretions clear            Ears- Hearing, canals-normal            Nose- Clear, no-Septal dev, mucus, polyps, erosion, perforation             Throat- Mallampati IV , mucosa clear , drainage- none, tonsils- atrophic, + teeth Neck- flexible , trachea midline, no stridor , thyroid nl, carotid no bruit Chest - symmetrical excursion , unlabored           Heart/CV- +RRR/ occ skip , murmur+1S , no gallop  , no rub, nl s1 s2                           - JVD-  none , edema- none, stasis changes- none, varices- none           Lung- clear to P&A, wheeze- none, cough- none , dullness-none, rub- none           Chest wall-  Abd-  Br/ Gen/ Rectal- Not done, not indicated Extrem- cyanosis- none, clubbing, none, atrophy- none, strength- nl Neuro- grossly intact to observation

## 2022-10-01 ENCOUNTER — Ambulatory Visit: Payer: BC Managed Care – PPO | Admitting: Internal Medicine

## 2022-10-08 ENCOUNTER — Other Ambulatory Visit: Payer: Self-pay | Admitting: Physician Assistant

## 2022-10-08 ENCOUNTER — Other Ambulatory Visit: Payer: Self-pay | Admitting: Cardiology

## 2022-10-08 DIAGNOSIS — G4733 Obstructive sleep apnea (adult) (pediatric): Secondary | ICD-10-CM | POA: Diagnosis not present

## 2022-10-25 ENCOUNTER — Ambulatory Visit: Payer: BC Managed Care – PPO | Admitting: General Practice

## 2022-11-08 DIAGNOSIS — G4733 Obstructive sleep apnea (adult) (pediatric): Secondary | ICD-10-CM | POA: Diagnosis not present

## 2022-11-12 ENCOUNTER — Encounter: Payer: Self-pay | Admitting: Internal Medicine

## 2022-12-31 IMAGING — MR MR HEAD W/O CM
12 of 13 series · 44 of 48 positions shown · non-contrast
Comparison: Head CT and CTA from yesterday

CLINICAL DATA: TIA.  Acute onset of fatigue and lightheadedness

EXAM:
MRI HEAD WITHOUT CONTRAST
TECHNIQUE: Multiplanar, multiecho pulse sequences of the brain and surrounding
structures were obtained without intravenous contrast.

[Series 5: DWI · axial · 3.0mm · 0.88mm/px · z∈[-80,+66]mm · 8 of 100 slices shown (1 of 4)]
[im 1/100]
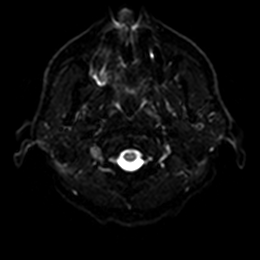
[im 15/100]
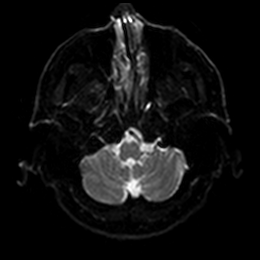
[im 29/100]
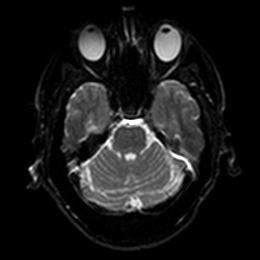
[im 43/100]
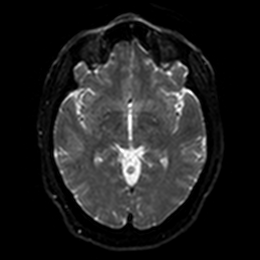
[im 57/100]
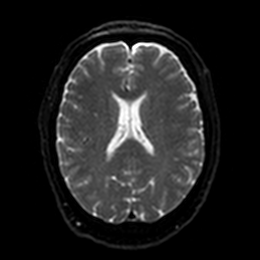
[im 71/100]
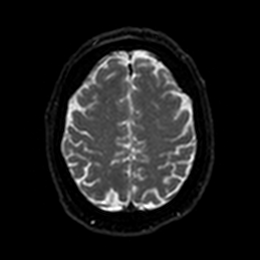
[im 85/100]
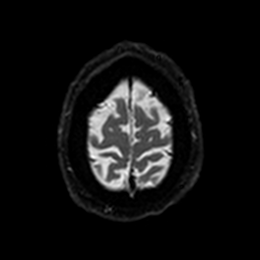
[im 100/100]
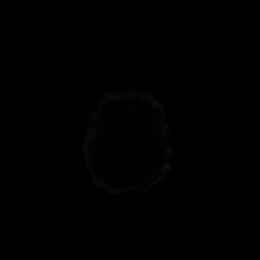

[Series 6: DWI · axial · 3.0mm · 0.88mm/px · z∈[-80,+66]mm · 4 of 50 slices shown (2 of 4)]
[im 1/50]
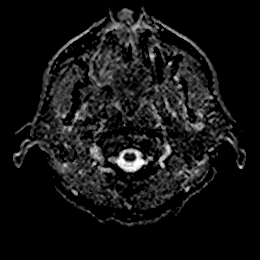
[im 17/50]
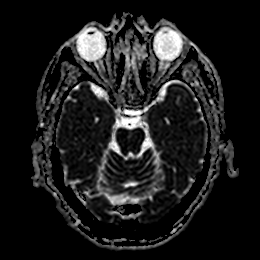
[im 33/50]
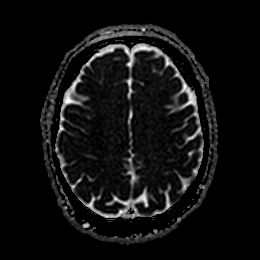
[im 50/50]
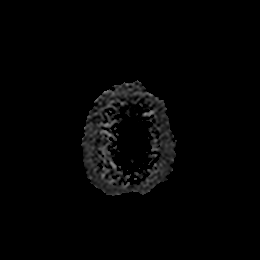

[Series 7: T1 · sagittal · 5.0mm · 0.75mm/px · 2 of 27 slices shown]
[im 1/27]
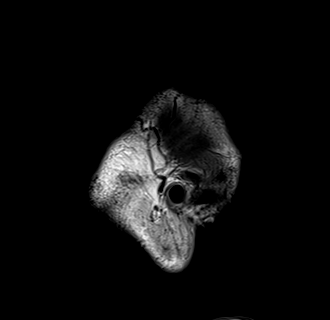
[im 27/27]
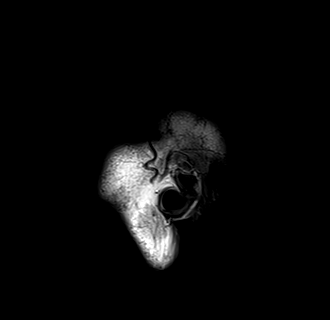

[Series 8: DWI · coronal · 4.0mm · 0.88mm/px · 5 of 68 slices shown (3 of 4)]
[im 1/68]
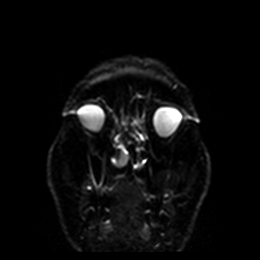
[im 17/68]
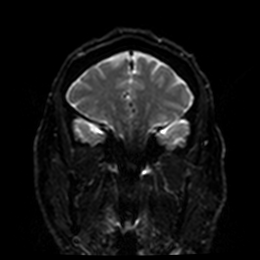
[im 34/68]
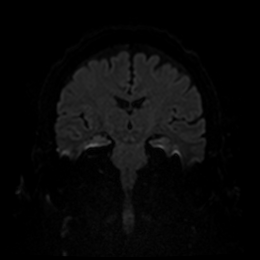
[im 51/68]
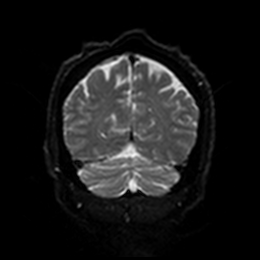
[im 68/68]
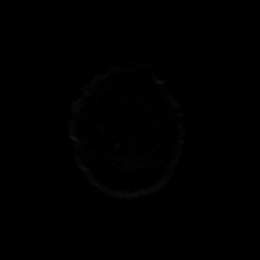

[Series 9: DWI · coronal · 4.0mm · 0.88mm/px · 3 of 34 slices shown (4 of 4)]
[im 1/34]
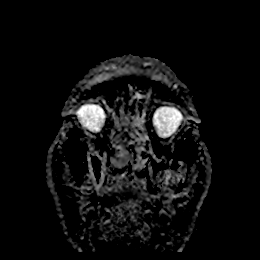
[im 17/34]
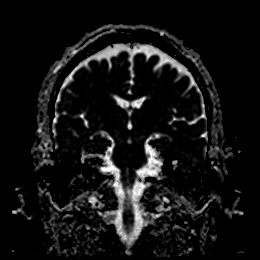
[im 34/34]
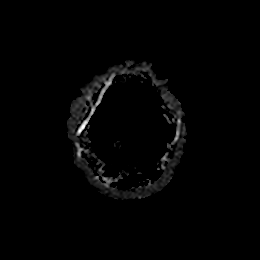

[Series 10: T2 · axial · 5.0mm · 0.72mm/px · z∈[-84,+72]mm · 2 of 27 slices shown (1 of 2)]
[im 1/27]
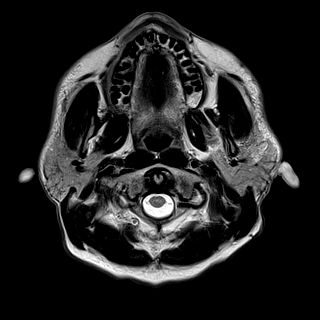
[im 27/27]
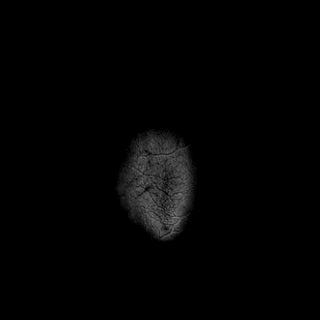

[Series 11: FLAIR · axial · 5.0mm · 0.45mm/px · z∈[-84,+72]mm · 2 of 27 slices shown]
[im 1/27]
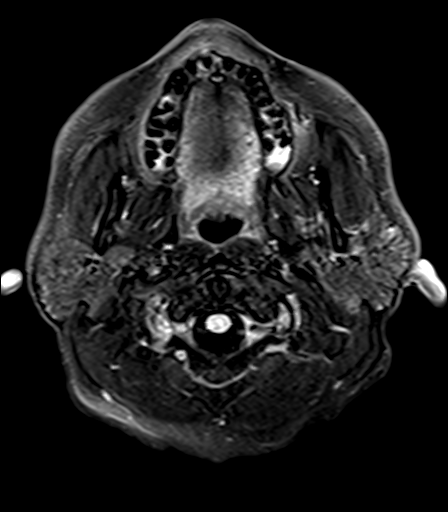
[im 27/27]
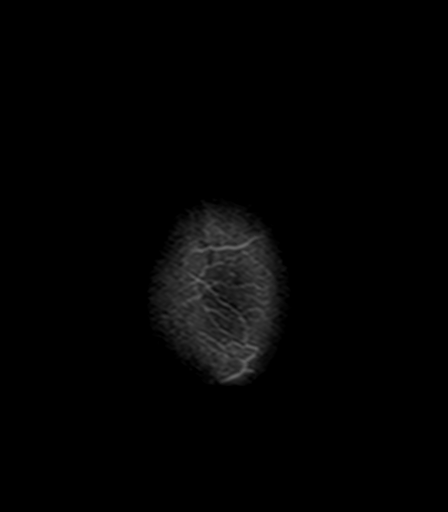

[Series 12: mag_images · axial · 3.0mm · 0.90mm/px · z∈[-88,+76]mm · 4 of 56 slices shown]
[im 1/56]
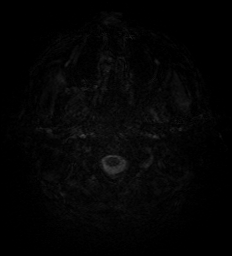
[im 19/56]
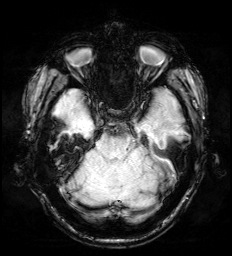
[im 37/56]
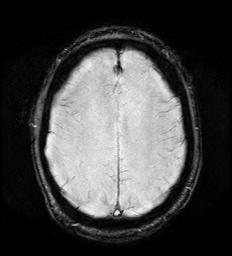
[im 56/56]
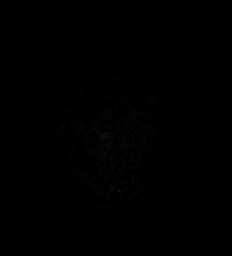

[Series 13: pha_images · axial · 3.0mm · 0.90mm/px · z∈[-88,+76]mm · 4 of 54 slices shown]
[im 1/54]
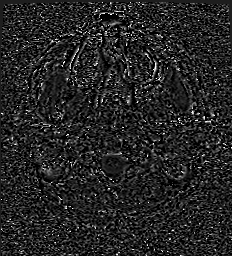
[im 18/54]
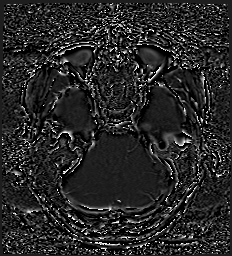
[im 36/54]
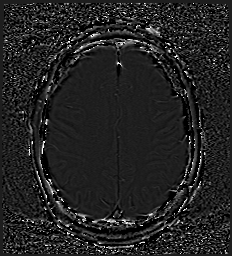
[im 54/54]
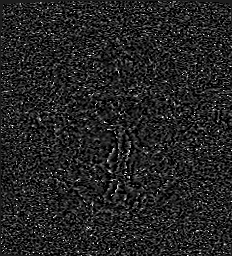

[Series 14: swi_images · axial · 3.0mm · 0.90mm/px · z∈[-88,+76]mm · 4 of 56 slices shown]
[im 1/56]
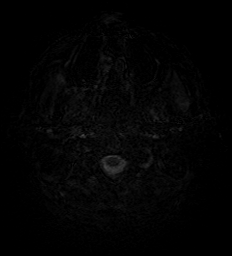
[im 19/56]
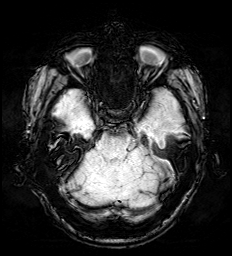
[im 37/56]
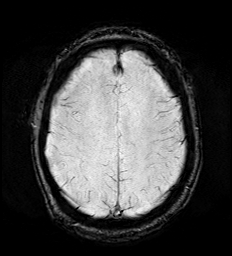
[im 56/56]
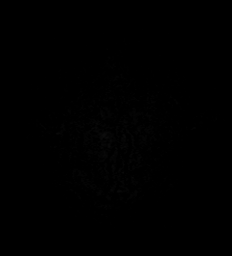

[Series 15: mip_images(sw) · axial · 24.0mm · 0.90mm/px · z∈[-78,+66]mm · 4 of 49 slices shown]
[im 1/49]
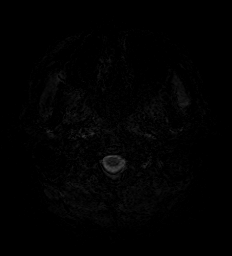
[im 17/49]
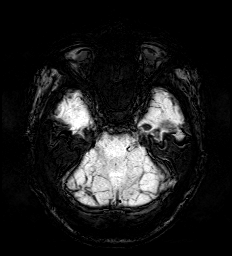
[im 33/49]
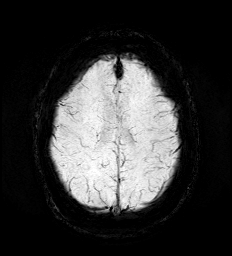
[im 49/49]
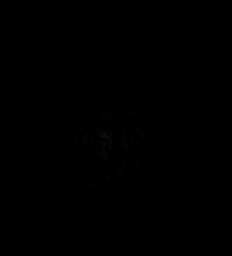

[Series 17: T2 · coronal · 5.0mm · 0.34mm/px · 2 of 30 slices shown (2 of 2)]
[im 1/30]
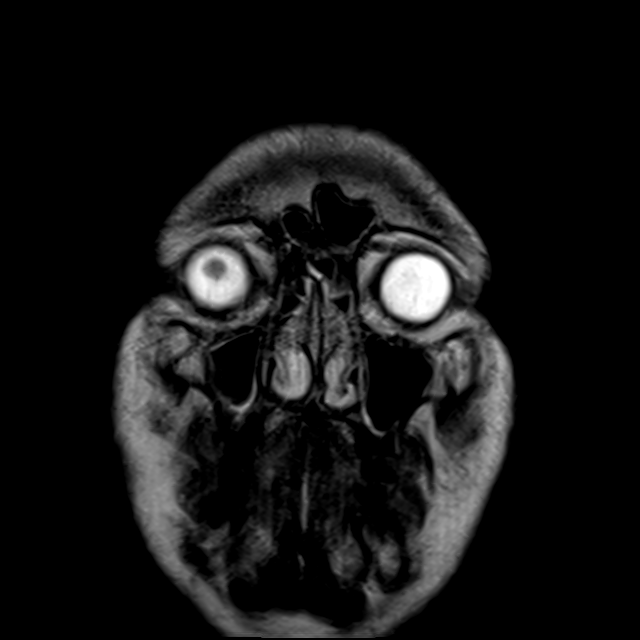
[im 30/30]
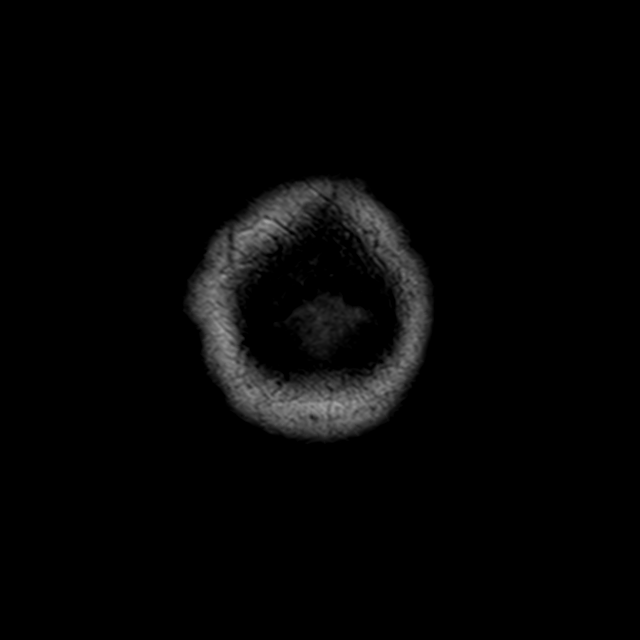

[44 of 48 positions shown; findings below may reference images not displayed]

FINDINGS: Brain: No acute infarction, hemorrhage, hydrocephalus, extra-axial
collection or mass lesion. 1 or 2 small remote white matter insults
- considered allowable for age. Normal brain volume.

Vascular: Normal flow voids

Skull and upper cervical spine: Normal marrow signal.

Sinuses/Orbits: Mild generalized mucosal thickening in paranasal
sinuses.
IMPRESSION: No acute finding or explanation for symptoms.

## 2023-02-24 DIAGNOSIS — M779 Enthesopathy, unspecified: Secondary | ICD-10-CM | POA: Diagnosis not present

## 2023-02-24 DIAGNOSIS — S82832A Other fracture of upper and lower end of left fibula, initial encounter for closed fracture: Secondary | ICD-10-CM | POA: Diagnosis not present

## 2023-02-24 DIAGNOSIS — M25572 Pain in left ankle and joints of left foot: Secondary | ICD-10-CM | POA: Diagnosis not present

## 2023-02-24 DIAGNOSIS — S99912A Unspecified injury of left ankle, initial encounter: Secondary | ICD-10-CM | POA: Diagnosis not present

## 2023-02-24 DIAGNOSIS — S82432A Displaced oblique fracture of shaft of left fibula, initial encounter for closed fracture: Secondary | ICD-10-CM | POA: Diagnosis not present

## 2023-02-24 DIAGNOSIS — W010XXA Fall on same level from slipping, tripping and stumbling without subsequent striking against object, initial encounter: Secondary | ICD-10-CM | POA: Diagnosis not present

## 2023-02-25 ENCOUNTER — Telehealth: Payer: Self-pay | Admitting: Cardiology

## 2023-02-25 IMAGING — CR DG CHEST 2V
2 series · 2 of 2 positions shown · non-contrast
Comparison: 06/17/2020

CLINICAL DATA: Chest pain and shortness of breath

EXAM:
CHEST - 2 VIEW

[chest pa]
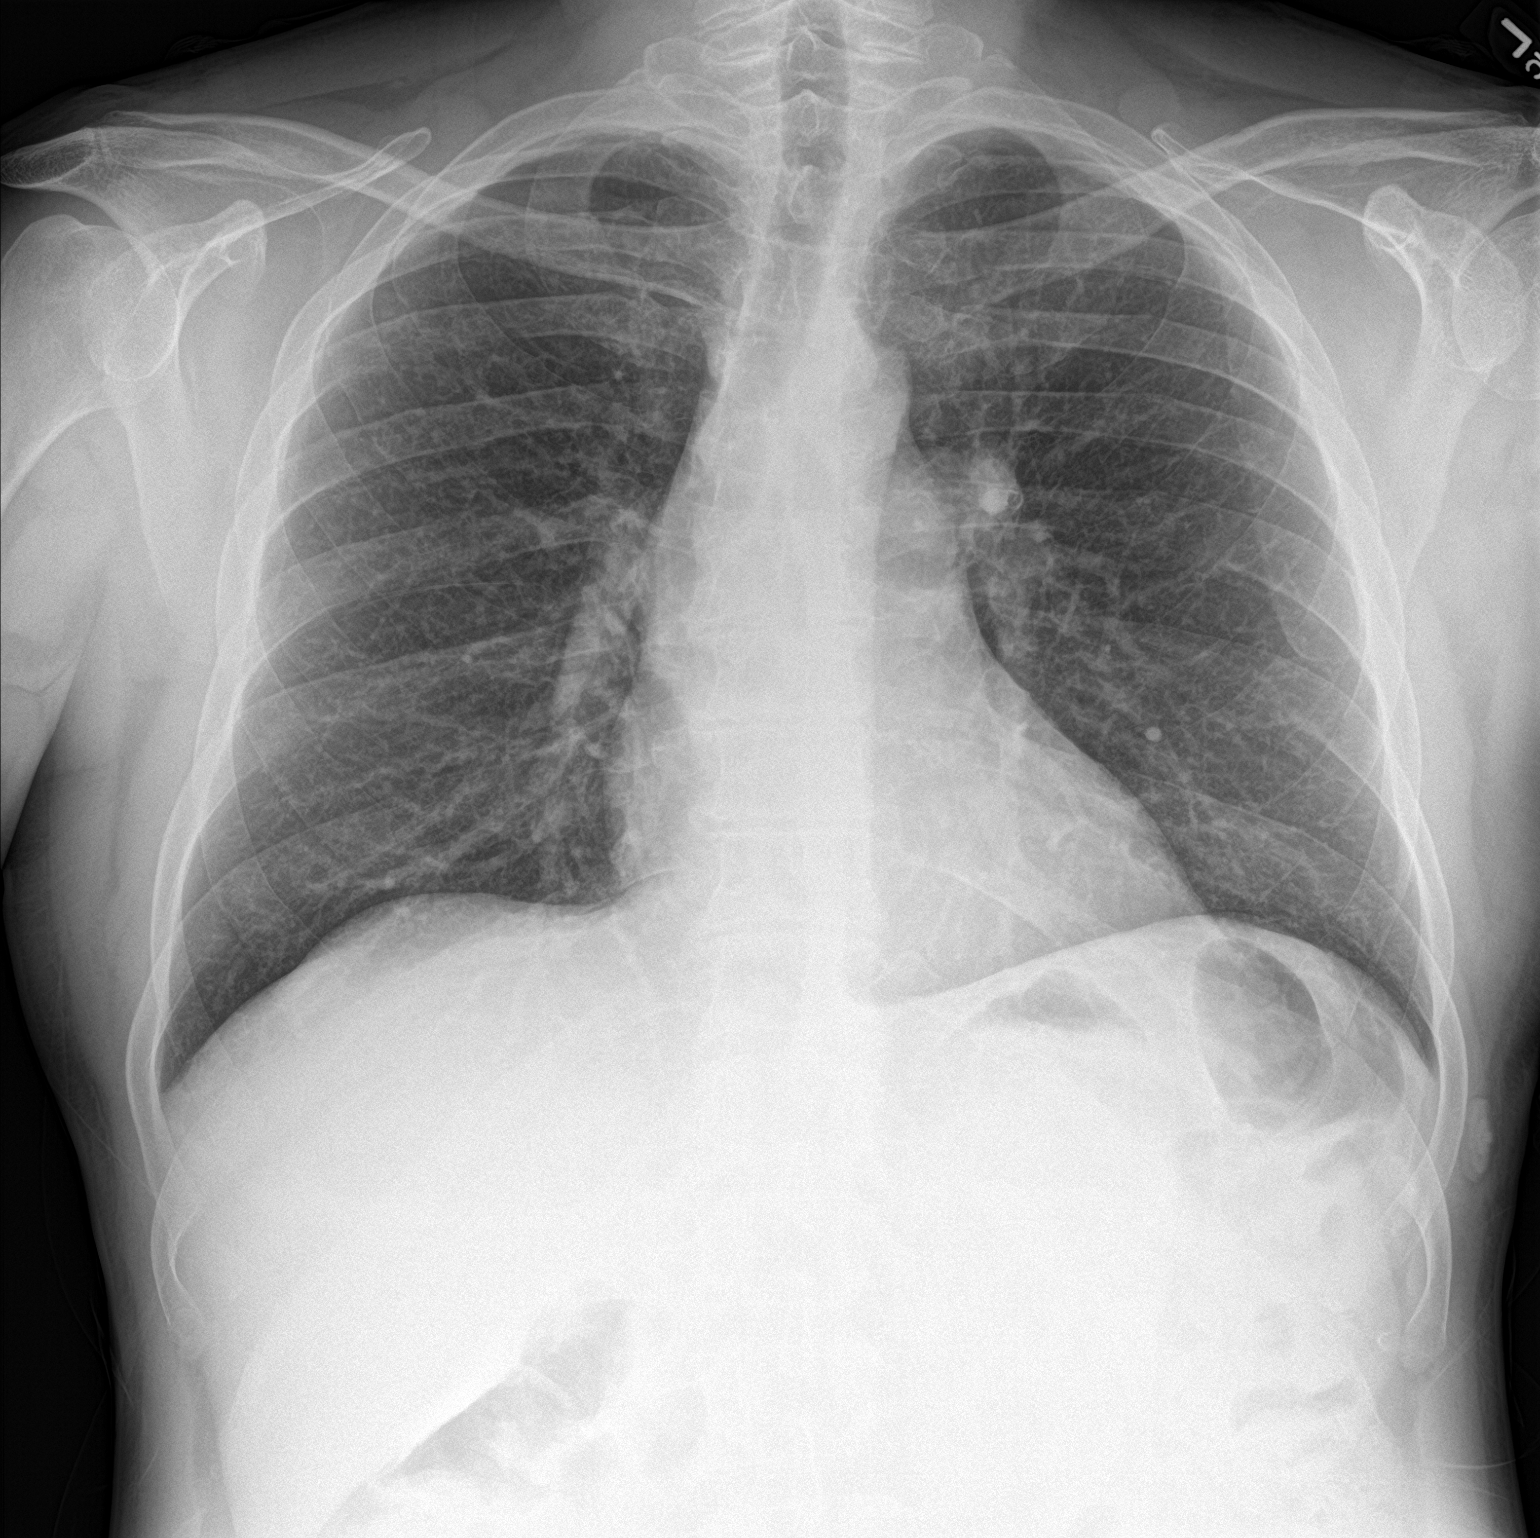

[chest lat]
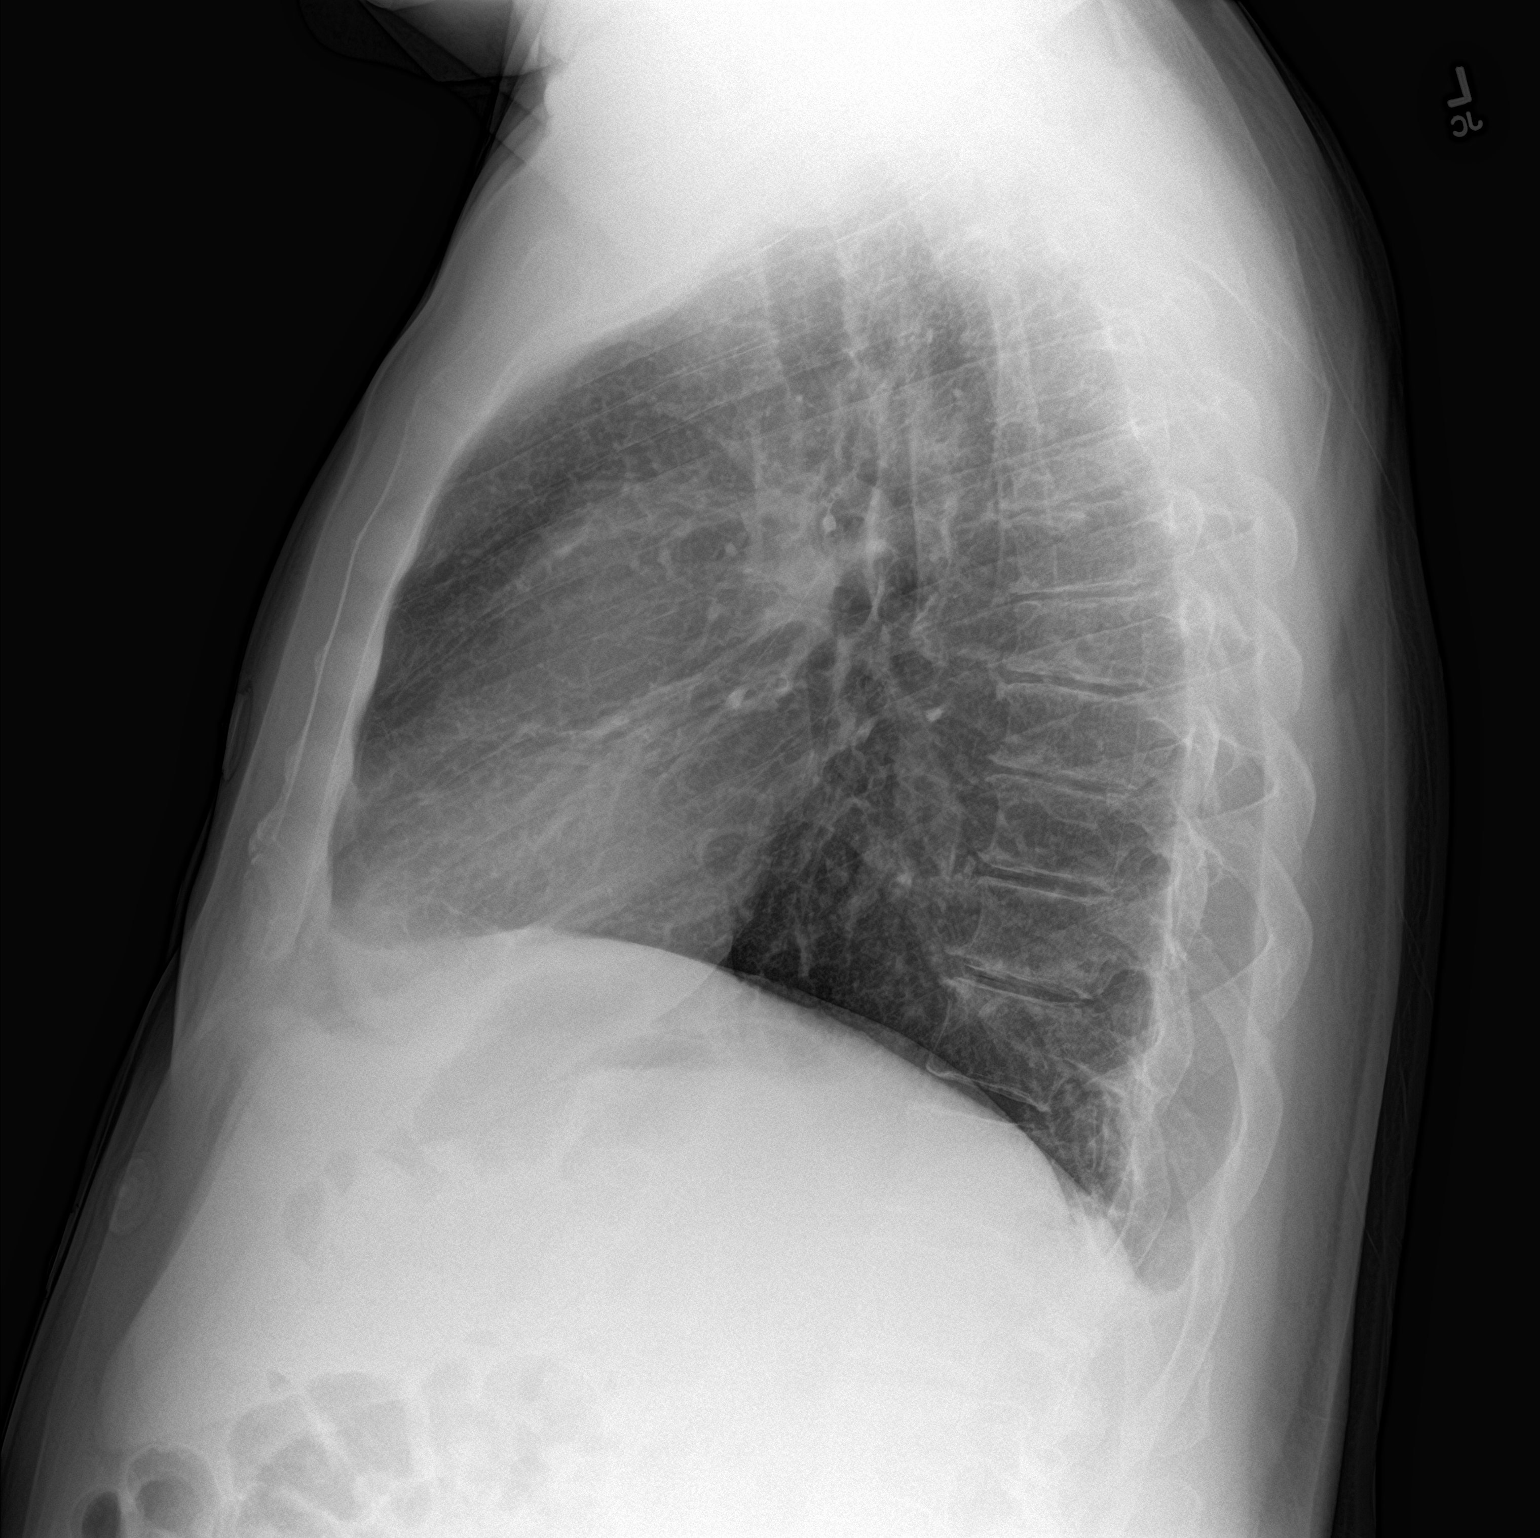

[2 of 2 positions shown; findings below may reference images not displayed]

FINDINGS: The heart size and mediastinal contours are within normal limits.
Both lungs are clear. The visualized skeletal structures are
unremarkable.
IMPRESSION: No active cardiopulmonary disease.

## 2023-02-25 NOTE — Telephone Encounter (Signed)
Patient stated he broke his ankle and went to the ED and while at the ED he was told he has an issue with the blood vessel leading from his foot to his heart.  Patient has visit scheduled on 1/22 and wants advice on next steps.

## 2023-02-25 NOTE — Telephone Encounter (Signed)
 Called patient with no answer. Left message to return call.

## 2023-02-28 NOTE — Telephone Encounter (Addendum)
 2nd attempt to call patient, no answer left message requesting a call back.   02/24/23 [facility] ED visit note:

## 2023-03-03 NOTE — Telephone Encounter (Signed)
 3rd attempt to call patient, no answer, left message requesting a call back Nursing will await for patient to return call

## 2023-03-13 DIAGNOSIS — N401 Enlarged prostate with lower urinary tract symptoms: Secondary | ICD-10-CM | POA: Diagnosis not present

## 2023-03-13 DIAGNOSIS — E1165 Type 2 diabetes mellitus with hyperglycemia: Secondary | ICD-10-CM | POA: Diagnosis not present

## 2023-03-13 DIAGNOSIS — Z23 Encounter for immunization: Secondary | ICD-10-CM | POA: Diagnosis not present

## 2023-03-13 DIAGNOSIS — E1169 Type 2 diabetes mellitus with other specified complication: Secondary | ICD-10-CM | POA: Diagnosis not present

## 2023-03-13 DIAGNOSIS — K219 Gastro-esophageal reflux disease without esophagitis: Secondary | ICD-10-CM | POA: Diagnosis not present

## 2023-03-14 DIAGNOSIS — E1165 Type 2 diabetes mellitus with hyperglycemia: Secondary | ICD-10-CM | POA: Diagnosis not present

## 2023-03-14 DIAGNOSIS — E1169 Type 2 diabetes mellitus with other specified complication: Secondary | ICD-10-CM | POA: Diagnosis not present

## 2023-03-14 DIAGNOSIS — S82832D Other fracture of upper and lower end of left fibula, subsequent encounter for closed fracture with routine healing: Secondary | ICD-10-CM | POA: Diagnosis not present

## 2023-03-14 DIAGNOSIS — M79662 Pain in left lower leg: Secondary | ICD-10-CM | POA: Diagnosis not present

## 2023-03-14 DIAGNOSIS — Z794 Long term (current) use of insulin: Secondary | ICD-10-CM | POA: Diagnosis not present

## 2023-03-14 DIAGNOSIS — K219 Gastro-esophageal reflux disease without esophagitis: Secondary | ICD-10-CM | POA: Diagnosis not present

## 2023-03-14 DIAGNOSIS — E785 Hyperlipidemia, unspecified: Secondary | ICD-10-CM | POA: Diagnosis not present

## 2023-03-16 NOTE — Progress Notes (Deleted)
 Cardiology Office Note    Date:  03/16/2023  ID:  Eric Bauer, DOB 04/17/1965, MRN [id number] PCP:  Ranae Pila, FNP  Cardiologist:  Olga Millers, MD  Electrophysiologist:  None   Chief Complaint: ***  History of Present Illness: .    Eric Bauer is a 58 y.o. male with visit-pertinent history of CAD, hyperlipidemia, dilated pulmonary artery, presyncope and sleep apnea.   Patient is followed by Dr. Jens Som.  He was previously seen in [facility] by Dr. Jeralene Huff, there was question of an atrial septal defect.  However transesophageal echocardiogram in December 2019 showed normal LV function, mild right ventricular enlargement, no atrial septal defect or other congenital abnormalities and no valvular heart disease was noted.  In 07/2020 he wore a cardiac monitor that showed sinus rhythm with rare PVCs.  Cardiac CTA in 07/2020 showed calcium score of 1693, moderate stenosis in the proximal mid LAD and severe stenosis in the distal LAD, FFR showed significant stenosis in the distal LAD.  Cardiac catheterization in June 2022 showed 40% proximal LAD stenosis.  In September 2023 he had a stress echocardiogram that was negative.  In April 2024 he was admitted to [facility] with chest pain, echocardiogram showed normal LV function, mild left ventricular hypertrophy, mild right ventricular enlargement, trace aortic and mitral regurgitation, dilated ascending aorta at 4.1 cm, density on the pulmonic valve felt to be dysplastic pulmonic valve versus vegetation versus thrombus, images were reviewed by cardiology at [facility] and no further workup was recommended.  Follow-up CTA in April 2024 showed no pulmonary embolus, stable dilation of the main pulmonary artery compatible with pulmonary hypertension, coronary artery disease.  [name] was last seen by Dr. Jens Som on 06/25/2022.  At that time patient noted dyspnea that occurred with activity and at rest, he continue to have occasional chest pressure  which is chronic and unchanged.  He denied problems with dizziness or syncope.   On chart review patient recently seen in the ED for closed fracture of distal end of left fibula, he has been seen by Ortho.  On 03/13/2023 he was seen by his PCP who noted possible concern for DVT, he had ultrasound of the left calf on 03/14/2023 that was negative for DVT.   Labwork independently reviewed: 03/14/2023: Hemoglobin 15, hematocrit 42.9, sodium 134, potassium 3.9, creatinine 0.82  ROS: .   *** denies chest pain, shortness of breath, lower extremity edema, fatigue, palpitations, melena, hematuria, hemoptysis, diaphoresis, weakness, presyncope, syncope, orthopnea, and PND.  All other systems are reviewed and otherwise negative.  Studies Reviewed: Marland Kitchen    EKG:  EKG is ordered today, personally reviewed, demonstrating ***  CV Studies: Cardiac studies reviewed are outlined and summarized above. Otherwise please see EMR for full report.   Current Reported Medications:.    No outpatient medications have been marked as taking for the 03/19/23 encounter (Appointment) with Rip Harbour, NP.    Physical Exam:    VS:  There were no vitals taken for this visit.   Wt Readings from Last 3 Encounters:  07/02/22 205 lb (93 kg)  06/25/22 199 lb 6.4 oz (90.4 kg)  02/26/22 205 lb (93 kg)    GEN: Well nourished, well developed in no acute distress NECK: No JVD; No carotid bruits CARDIAC: ***RRR, no murmurs, rubs, gallops RESPIRATORY:  Clear to auscultation without rales, wheezing or rhonchi  ABDOMEN: Soft, non-tender, non-distended EXTREMITIES:  No edema; No acute deformity   Asessement and Plan:.     ***  Disposition: F/u with ***  Signed, Rip Harbour, NP

## 2023-03-19 ENCOUNTER — Ambulatory Visit: Payer: BC Managed Care – PPO | Admitting: Cardiology

## 2023-03-19 DIAGNOSIS — E1169 Type 2 diabetes mellitus with other specified complication: Secondary | ICD-10-CM | POA: Diagnosis not present

## 2023-03-19 DIAGNOSIS — G4733 Obstructive sleep apnea (adult) (pediatric): Secondary | ICD-10-CM

## 2023-03-19 DIAGNOSIS — E7800 Pure hypercholesterolemia, unspecified: Secondary | ICD-10-CM

## 2023-03-19 DIAGNOSIS — I2510 Atherosclerotic heart disease of native coronary artery without angina pectoris: Secondary | ICD-10-CM

## 2023-03-19 DIAGNOSIS — K219 Gastro-esophageal reflux disease without esophagitis: Secondary | ICD-10-CM | POA: Diagnosis not present

## 2023-03-19 DIAGNOSIS — E1165 Type 2 diabetes mellitus with hyperglycemia: Secondary | ICD-10-CM | POA: Diagnosis not present

## 2023-03-19 NOTE — Progress Notes (Unsigned)
Cardiology Office Note    Date:  03/20/2023  ID:  Eric Bauer February 19, 1966, MRN [id number] PCP:  Eric Pila, FNP  Cardiologist:  Eric Millers, MD  Electrophysiologist:  None   Chief Complaint: ED follow up   History of Present Illness: .    Eric Bauer is a 58 y.o. male with visit-pertinent history of CAD, hyperlipidemia, dilated pulmonary artery, presyncope and sleep apnea.   Patient is followed by Dr. Jens Bauer.  He was previously seen in [facility] by Dr. Jeralene Bauer, there was question of an atrial septal defect.  However transesophageal echocardiogram in December 2019 showed normal LV function, mild right ventricular enlargement, no atrial septal defect or other congenital abnormalities and no valvular heart disease was noted.  In 07/2020 he wore a cardiac monitor that showed sinus rhythm with rare PVCs.  Cardiac CTA in 07/2020 showed calcium score of 1693, moderate stenosis in the proximal mid LAD and severe stenosis in the distal LAD, FFR showed significant stenosis in the distal LAD.  Cardiac catheterization in June 2022 showed 40% proximal LAD stenosis.  In September 2023 he had a stress echocardiogram that was negative.  In April 2024 he was admitted to [facility] with chest pain, echocardiogram showed normal LV function, mild left ventricular hypertrophy, mild right ventricular enlargement, trace aortic and mitral regurgitation, dilated ascending aorta at 4.1 cm, density on the pulmonic valve felt to be dysplastic pulmonic valve versus vegetation versus thrombus, images were reviewed by cardiology at [facility] and no further workup was recommended.  Follow-up CTA in April 2024 showed no pulmonary embolus, stable dilation of the main pulmonary artery compatible with pulmonary hypertension, coronary artery disease.  Eric Bauer was last seen by Dr. Jens Bauer on 06/25/2022.  At that time patient noted dyspnea that occurred with activity and at rest, he continue to have occasional chest  pressure which is chronic and unchanged.  He denied problems with dizziness or syncope.   On chart review patient recently seen in the ED for closed fracture of distal end of left fibula, he has been seen by Ortho.  On 03/13/2023 he was seen by his PCP who noted possible concern for DVT, he had ultrasound of the left calf on 03/14/2023 that was negative for DVT.  Today he presents for " possible plaque buildup in his feet."  Patient reports that when he was recently seen by his Ortho doctor he noted concern that he could have blockages in his feet " that could travel to his heart".  On chart review his doctor noted possible atherosclerosis on x-rays.  Patient denies any symptoms of claudication, discoloration of the feet or any wounds.  He denies any current chest pain, shortness of breath, lower extreme edema, orthopnea or PND.  Labwork independently reviewed: 03/14/2023: Hemoglobin 15, hematocrit 42.9, sodium 134, potassium 3.9, creatinine 0.82  ROS: .   Today he denies chest pain, shortness of breath, lower extremity edema, fatigue, palpitations, melena, hematuria, hemoptysis, diaphoresis, weakness, presyncope, syncope, orthopnea, and PND.  All other systems are reviewed and otherwise negative. Studies Reviewed: Marland Kitchen    EKG:  EKG is ordered today, personally reviewed, demonstrating  EKG Interpretation Date/Time:  Thursday March 20 2023 13:57:11 EST Ventricular Rate:  85 PR Interval:  140 QRS Duration:  90 QT Interval:  342 QTC Calculation: 406 R Axis:   55  Text Interpretation: Normal sinus rhythm Normal ECG When compared with ECG of 15-Aug-2020 08:36, No significant change was found Confirmed by Reather Littler [phone]) on  03/20/2023 2:00:38 PM   CV Studies:  Cardiac Studies & Procedures   CARDIAC CATHETERIZATION  CARDIAC CATHETERIZATION 08/15/2020  Narrative Images from the original result were not included.   Prox LAD lesion is 40% stenosed.  Eric Bauer is a 58 y.o.  male   [id number] LOCATION:  FACILITY: [facility] PHYSICIAN: Eric Bauer, M.D. Aug 12, 1965   DATE OF PROCEDURE:  08/15/2020  DATE OF DISCHARGE:     CARDIAC CATHETERIZATION    History obtained from chart review.Eric Bauer is followed by Dr. Jens Bauer and was recently found to have an elevated CAC of 1693 with moderate stenosis to the proximal/mid LAD and severe stenosis in the distal LAD that was FFR positive.  During clinic follow-up he was only having dyspnea with exertional activities however he has noted that the symptoms have become more frequent and progressive over the past few months.  He had no similar symptoms prior to April 2022 when he had a syncopal event at home with his family.  In April he had a presyncope/syncope event while at home with his family and was admitted with altered consciousness and memory loss along with chest pain and a questionable left facial droop.  TTE during that time showed a normal LVEF.  CTA of the head and neck along with brain MRI were unremarkable.  EEG was also normal at that time.  Lab work including a troponin and D-dimer within normal limits.  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  This past week he had symptoms with walking around doing minimal physical activity at work.  He was plan for coronary evaluation this Thursday however on Saturday he was out with his sister and had an acute onset episode of shortness of breath while they were at lunch followed by severe right upper extremity, back, and chest discomfort.  This lasted for several hours before eventually going away.  He had similar recurrent symptoms on Sunday when he was at home with his family and sitting down doing nothing.  The pain again was fairly severe and occurred at rest.  His family at that time strongly encouraged him to seek further  evaluation in the ED.  During most these episodes he also has accompanying nausea.  He denies any orthopnea, PND, weight gain, or lower extremity edema.   IMPRESSION:Mr Minella has normal right heart pressures and minimally obstructive CAD.  I believe that his coronary CTA overcalled the degree of obstructive disease.  Medical therapy will be recommended.  The sheath was removed and a TR band was placed on the right wrist to achieve patent hemostasis.  The patient left lab in stable condition.  Jonathan Berry. MD, FACC 08/15/2020 11:36 AM  Findings Coronary Findings Diagnostic  Dominance: Right  Left Anterior Descending Prox LAD lesion is 40% stenosed.  Intervention  No interventions have been documented.    ECHOCARDIOGRAM  ECHOCARDIOGRAM COMPLETE 06/18/2020  Narrative ECHOCARDIOGRAM REPORT    Patient Name:   Reeder Pint Date of Exam: 06/18/2020 Medical Rec #:  [id number]        Height:       68.0 in Accession #:    [id number]       Weight:       20 2.4 lb Date of Birth:  09/03/1965        BSA:          2.054  m Patient Age:    54 years         BP:           96/64 mmHg Patient Gender: M                HR:           91 bpm. Exam Location:  Inpatient  Procedure: 2D Echo  Indications:    chest pain. TIA.  History:        Patient has no prior history of Echocardiogram examinations. Risk Factors:Diabetes and Dyslipidemia.  Sonographer:    Delcie Roch Referring Phys: [id number] Fahad S OPYD  IMPRESSIONS   1. Left ventricular ejection fraction, by estimation, is 60 to 65%. The left ventricle has normal function. The left ventricle has no regional wall motion abnormalities. Left ventricular diastolic parameters were normal. 2. Right ventricular systolic function is normal. The right ventricular size is normal. Tricuspid regurgitation signal is inadequate for assessing PA pressure. 3. The mitral valve is normal in structure. Trivial mitral valve regurgitation. No  evidence of mitral stenosis. 4. The aortic valve is tricuspid. Aortic valve regurgitation is not visualized. No aortic stenosis is present. 5. The inferior vena cava is normal in size with greater than 50% respiratory variability, suggesting right atrial pressure of 3 mmHg.  FINDINGS Left Ventricle: Left ventricular ejection fraction, by estimation, is 60 to 65%. The left ventricle has normal function. The left ventricle has no regional wall motion abnormalities. The left ventricular internal cavity size was normal in size. There is no left ventricular hypertrophy. Left ventricular diastolic parameters were normal.  Right Ventricle: The right ventricular size is normal.Right ventricular systolic function is normal. Tricuspid regurgitation signal is inadequate for assessing PA pressure. The tricuspid regurgitant velocity is 2.28 m/s, and with an assumed right atrial pressure of 3 mmHg, the estimated right ventricular systolic pressure is 23.8 mmHg.  Left Atrium: Left atrial size was normal in size.  Right Atrium: Right atrial size was normal in size.  Pericardium: There is no evidence of pericardial effusion.  Mitral Valve: The mitral valve is normal in structure. Trivial mitral valve regurgitation. No evidence of mitral valve stenosis.  Tricuspid Valve: The tricuspid valve is normal in structure. Tricuspid valve regurgitation is trivial. No evidence of tricuspid stenosis.  Aortic Valve: The aortic valve is tricuspid. Aortic valve regurgitation is not visualized. No aortic stenosis is present.  Pulmonic Valve: The pulmonic valve was normal in structure. Pulmonic valve regurgitation is not visualized. No evidence of pulmonic stenosis.  Aorta: The aortic root is normal in size and structure.  Venous: The inferior vena cava is normal in size with greater than 50% respiratory variability, suggesting right atrial pressure of 3 mmHg.  IAS/Shunts: No atrial level shunt detected by color flow  Doppler.   LEFT VENTRICLE PLAX 2D LVIDd:         4.80 cm Diastology LVIDs:         2.70 cm LV e' medial:    11.60 cm/s LV PW:         1.10 cm LV E/e' medial:  6.9 LV IVS:        1.00 cm LV e' lateral:   12.80 cm/s LV E/e' lateral: 6.3   RIGHT VENTRICLE             IVC RV S prime:     18.90 cm/s  IVC diam: 1.80 cm TAPSE (M-mode): 2.3 cm  LEFT ATRIUM  Index       RIGHT ATRIUM          Index LA diam:        4.20 cm 2.04 cm/m  RA Area:     9.86 cm LA Vol (A2C):   28.8 ml 14.02 ml/m RA Volume:   17.70 ml 8.62 ml/m LA Vol (A4C):   32.0 ml 15.58 ml/m LA Biplane Vol: 31.5 ml 15.34 ml/m AORTIC VALVE LVOT Vmax:   127.00 cm/s LVOT Vmean:  80.800 cm/s LVOT VTI:    0.196 m  AORTA Ao Asc diam: 3.50 cm  MITRAL VALVE               TRICUSPID VALVE MV Area (PHT): 5.54 cm    TR Peak grad:   20.8 mmHg MV Decel Time: 137 msec    TR Vmax:        228.00 cm/s MV E velocity: 80.20 cm/s MV A velocity: 65.50 cm/s  SHUNTS MV E/A ratio:  1.22        Systemic VTI: 0.20 m  Eric Millers MD Electronically signed by Eric Millers MD Signature Date/Time: 06/18/2020/11:30:19 AM    Final   MONITORS  CARDIAC EVENT MONITOR 07/31/2020  Narrative Sinus bradycardia, NSR, sinus tachycardia; rare PVC. Eric Bauer  CT SCANS  CT CORONARY FRACTIONAL FLOW RESERVE DATA PREP 08/04/2020  Addendum 08/07/2020  1:49 PM ADDENDUM REPORT: 08/07/2020 13:47  EXAM: OVER-READ INTERPRETATION  CT CHEST  The following report is an over-read performed by radiologist Dr. Royal Piedra [facility] on 08/07/2020. This over-read does not include interpretation of cardiac or coronary anatomy or pathology. The coronary calcium score and cardiac CTA interpretation by the cardiologist is attached.  COMPARISON:  None.  FINDINGS: Atherosclerotic calcifications in the thoracic aorta. Dilatation of the pulmonic trunk (3.9 cm in diameter). Within the visualized portions of the thorax  there are no suspicious appearing pulmonary nodules or masses, there is no acute consolidative airspace disease, no pleural effusions, no pneumothorax and no lymphadenopathy. Visualized portions of the upper abdomen are unremarkable. There are no aggressive appearing lytic or blastic lesions noted in the visualized portions of the skeleton.  IMPRESSION: 1. Dilatation of the pulmonic trunk (3.9 cm in diameter), concerning for pulmonary arterial hypertension. 2. Aortic Atherosclerosis (ICD10-I70.0).   Electronically Signed By: Trudie Reed M.D. On: 08/07/2020 13:47  Narrative EXAM: CT FFR ANALYSIS  CLINICAL DATA:  abnormal cardiac CT  FINDINGS: FFRct analysis was performed on the original cardiac CT angiogram dataset. Diagrammatic representation of the FFRct analysis is provided in a separate PDF document in PACS. This dictation was created using the PDF document and an interactive 3D model of the results. 3D model is not available in the EMR/PACS. Normal FFR range is >0.80. Indeterminate (grey) zone is 0.76-0.80.  1. Left Main: FFR = 0.97  2. LAD: Proximal FFR = 0.92, Mid FFR = 0.85, Distal FFR = 0.73 3. LCX: Proximal FFR = 0.98, distal FFR = 0.89 4. RCA: Proximal FFR = 0.96, Mid FFR =0.92, Distal FFR = 0.86  IMPRESSION: 1. CT FFR analysis showed significant stenosis in the distal LAD, consistent with the severe focal stenosis in the distal LAD noted on CCTA. This is a small caliber portion of the vessel.  RECOMMENDATIONS: Guideline-directed medical therapy and aggressive risk factor modification for secondary prevention of coronary artery disease. If symptoms persist, consider coronary angiography given significant calcifications in LAD with images subject to artifact.  Electronically Signed: By: Weston Brass On: 08/04/2020  17:02   CT CORONARY MORPH W/CTA COR W/SCORE 08/04/2020  Narrative HISTORY: Pericardial disease suspected  EXAM: Cardiac/Coronary   CT  TECHNIQUE: The patient was scanned on a [employer].  PROTOCOL: A 120 kV prospective scan was triggered in the descending thoracic aorta at 111 HU's. Axial non-contrast 3 mm slices were carried out through the heart. The data set was analyzed on a dedicated work station and scored using the Agatston method. Gantry rotation speed was 250 msecs and collimation was .6 mm. Beta blockade and 0.8 mg of sl NTG was given. The 3D data set was reconstructed in 5% intervals of the 35-75 % of the R-R cycle. Systolic and diastolic phases were analyzed on a dedicated work station using MPR, MIP and VRT modes. The patient received OMNIPAQUE IOHEXOL 350 MG/ML SOLN contrast.  FINDINGS: Image quality: Average  Noise artifact is: Moderate, reduced signal to noise ratio.  Coronary calcium score is 1693, which places the patient in the 99th percentile for age and sex matched control.  Coronary arteries: Normal coronary origins.  Right dominance.  Right Coronary Artery: Serial mild mixed atherosclerotic plaque in the proximal RCA, 25-49% stenosis. Mild mixed atherosclerotic plaque in the mid and distal RCA, 25-49% stenosis. Patent PDA and PLA.  Left Main Coronary Artery: Minimal mixed atherosclerotic plaque in the distal LM, <25% stenosis.  Left Anterior Descending Coronary Artery: Diffusely diseased ostial to proximal LAD. Serial moderate mixed atherosclerotic stenoses in the proximal and mid LAD, 50-69% stenosis. Image quality limits precise assessment of luminal stenosis. Probable severe stenosis in the distal LAD 70-99% stenosis.  Left Circumflex Artery: Mild scattered mixed atherosclerotic plaque in the proximal L circumflex, 25-49% stenosis.  Aorta: Borderline dilation, 38 mm at the mid ascending aorta (level of the PA bifurcation), and 37 x 37 x 37 mm at the sinus of Valsalva, measured double oblique. No dissection, trivial calcifications.  Aortic Valve: No  calcifications.  Other findings:  Normal pulmonary vein drainage into the left atrium.  Normal left atrial appendage without a thrombus.  Severe dilation of main pulmonary artery, 40 mm, suggestive of pulmonary hypertension.  IMPRESSION: 1. Moderate-severe CAD in the proximal, mid and distal LAD, CADRADS = 4. CT FFR will be performed and reported separately.  2. Coronary calcium score is 1693, which places the patient in the 99th percentile for age and sex matched control.  3. Normal coronary origin with right dominance.  4. Severe dilation of main pulmonary artery, 40 mm, suggestive of pulmonary hypertension.  5. Borderline dilation of ascending aorta, 38 mm at mid ascending aorta.   Electronically Signed By: Weston Brass On: 08/04/2020 15:47           Current Reported Medications:.    Current Meds  Medication Sig   aspirin EC 81 MG EC tablet Take 1 tablet (81 mg total) by mouth daily. Swallow whole.   escitalopram (LEXAPRO) 20 MG tablet Take by mouth.   isosorbide mononitrate (IMDUR) 30 MG 24 hr tablet TAKE 1/2 OF A TABLET (15 MG TOTAL) BY MOUTH DAILY   lisinopril (ZESTRIL) 10 MG tablet Take 1 tablet (10 mg total) by mouth at bedtime.   metFORMIN (GLUCOPHAGE) 500 MG tablet Take by mouth.   metoprolol succinate (TOPROL-XL) 25 MG 24 hr tablet TAKE 1 TABLET (25 MG TOTAL) BY MOUTH DAILY.   omeprazole (PRILOSEC) 20 MG capsule Take by mouth.   oxybutynin (DITROPAN-XL) 5 MG 24 hr tablet Take 5 mg by mouth daily.   rosuvastatin (CRESTOR)  40 MG tablet Take 40 mg by mouth at bedtime.   tamsulosin (FLOMAX) 0.4 MG CAPS capsule Take 0.8 mg by mouth at bedtime.   TRESIBA FLEXTOUCH 100 UNIT/ML FlexTouch Pen Inject 46 Units into the skin at bedtime.   Physical Exam:    VS:  BP 128/80   Pulse 80   Ht 5' 8.5" (1.74 m)   Wt 183 lb (83 kg)   SpO2 99%   BMI 27.42 kg/m    Wt Readings from Last 3 Encounters:  03/20/23 183 lb (83 kg)  07/02/22 205 lb (93 kg)  06/25/22 199  lb 6.4 oz (90.4 kg)    GEN: Well nourished, well developed in no acute distress NECK: No JVD; No carotid bruits CARDIAC: RRR, no murmurs, rubs, gallops RESPIRATORY:  Clear to auscultation without rales, wheezing or rhonchi  ABDOMEN: Soft, non-tender, non-distended EXTREMITIES:  No edema; No acute deformity   Asessement and Plan:.    PVD?:  Patiently recently diagnosed with a distal fibular fracture of the left foot.  Per notes from his Ortho doctor x-rays indicated possible atherosclerosis developing in his feet.  Today patient denies any symptoms of claudication, or pain pain.  He has no discoloration and pulses are equal bilaterally, he has good capillary refill.  He has no lower extremity wounds.  Discussed checking ABIs with patient however he has continued to have left ankle pain related to his fracture.  Patient will have follow-up in 3 months in order to allow ankle to heal, we will plan for ABIs at that time.  CAD/HLD:  Cardiac CTA in 07/2020 showed calcium score of 1693, moderate stenosis in the proximal mid LAD and severe stenosis in the distal LAD, FFR showed significant stenosis in the distal LAD.  Cardiac catheterization in June 2022 showed 40% proximal LAD stenosis.  Today patient reports that he is feeling well, denies any current chest pain or shortness of breath. Heart healthy diet and regular cardiovascular exercise encouraged.  Continue aspirin and Crestor.  Dilated pulmonary artery: Previously noted on CTA, suggestive of pulmonary hypertension.  Follow-up catheterization did not reveal elevated pulmonary pressures and previous TEE showed no ASD.  History of presyncope: Neurology previously evaluated and felt episodes were likely vagal in etiology.  He denies any episodes of presyncope or syncope today.    Disposition: F/u with Dr. Jens Bauer or Reather Littler, NP in 3 months.   Signed, Rip Harbour, NP

## 2023-03-20 ENCOUNTER — Ambulatory Visit: Payer: BC Managed Care – PPO | Attending: Cardiology | Admitting: Cardiology

## 2023-03-20 ENCOUNTER — Encounter: Payer: Self-pay | Admitting: Cardiology

## 2023-03-20 VITALS — BP 128/80 | HR 80 | Ht 68.5 in | Wt 183.0 lb

## 2023-03-20 DIAGNOSIS — R0609 Other forms of dyspnea: Secondary | ICD-10-CM

## 2023-03-20 DIAGNOSIS — I2510 Atherosclerotic heart disease of native coronary artery without angina pectoris: Secondary | ICD-10-CM | POA: Diagnosis not present

## 2023-03-20 DIAGNOSIS — R55 Syncope and collapse: Secondary | ICD-10-CM | POA: Diagnosis not present

## 2023-03-20 DIAGNOSIS — E7800 Pure hypercholesterolemia, unspecified: Secondary | ICD-10-CM

## 2023-03-20 NOTE — Patient Instructions (Signed)
Medication Instructions:  No changes *If you need a refill on your cardiac medications before your next appointment, please call your pharmacy*  Lab Work: No labs  Testing/Procedures: No testing  Follow-Up: At [facility], you and your health needs are our priority.  As part of our continuing mission to provide you with exceptional heart care, we have created designated Provider Care Teams.  These Care Teams include your primary Cardiologist (physician) and Advanced Practice Providers (APPs -  Physician Assistants and Nurse Practitioners) who all work together to provide you with the care you need, when you need it.  We recommend signing up for the patient portal called "MyChart".  Sign up information is provided on this After Visit Summary.  MyChart is used to connect with patients for Virtual Visits (Telemedicine).  Patients are able to view lab/test results, encounter notes, upcoming appointments, etc.  Non-urgent messages can be sent to your provider as well.   To learn more about what you can do with MyChart, go to [email or web address].    Your next appointment:   3 month(s)  Provider:   Reather Littler, NP

## 2023-03-24 DIAGNOSIS — S82832A Other fracture of upper and lower end of left fibula, initial encounter for closed fracture: Secondary | ICD-10-CM | POA: Diagnosis not present

## 2023-04-28 DIAGNOSIS — S82832A Other fracture of upper and lower end of left fibula, initial encounter for closed fracture: Secondary | ICD-10-CM | POA: Diagnosis not present

## 2023-04-28 DIAGNOSIS — S82832D Other fracture of upper and lower end of left fibula, subsequent encounter for closed fracture with routine healing: Secondary | ICD-10-CM | POA: Diagnosis not present

## 2023-06-12 NOTE — Progress Notes (Deleted)
 Cardiology Office Note    Date:  06/12/2023  ID:  Eric Bauer, DOB 03-21-1965, MRN [id number] PCP:  Ranae Pila, FNP  Cardiologist:  Olga Millers, MD  Electrophysiologist:  None   Chief Complaint: ***  History of Present Illness: .    Eric Bauer is a 58 y.o. male with visit-pertinent history of ***  Labwork independently reviewed:   ROS: .   *** denies chest pain, shortness of breath, lower extremity edema, fatigue, palpitations, melena, hematuria, hemoptysis, diaphoresis, weakness, presyncope, syncope, orthopnea, and PND.  All other systems are reviewed and otherwise negative.  Studies Reviewed: Marland Kitchen    EKG:  EKG is ordered today, personally reviewed, demonstrating ***     CV Studies: Cardiac studies reviewed are outlined and summarized above. Otherwise please see EMR for full report. Cardiac Studies & Procedures   ______________________________________________________________________________________________ CARDIAC CATHETERIZATION  CARDIAC CATHETERIZATION 08/15/2020  Conclusion Images from the original result were not included.   Prox LAD lesion is 40% stenosed.  Eric Bauer is a 58 y.o. male   [id number] LOCATION:  FACILITY: [facility] PHYSICIAN: Nanetta Batty, M.D. [birth year]   DATE OF PROCEDURE:  08/15/2020  DATE OF DISCHARGE:     CARDIAC CATHETERIZATION    History obtained from chart review.Eric Bauer is followed by Dr. Jens Som and was recently found to have an elevated CAC of 1693 with moderate stenosis to the proximal/mid LAD and severe stenosis in the distal LAD that was FFR positive.  During clinic follow-up he was only having dyspnea with exertional activities however he has noted that the symptoms have become more frequent and progressive over the past few months.  He had no similar symptoms prior to April 2022 when he had a syncopal event at home with his family.  In April he had a presyncope/syncope event while at home with his family  and was admitted with altered consciousness and memory loss along with chest pain and a questionable left facial droop.  TTE during that time showed a normal LVEF.  CTA of the head and neck along with brain MRI were unremarkable.  EEG was also normal at that time.  Lab work including a troponin and D-dimer within normal limits.  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  This past week he had symptoms with walking around doing minimal physical activity at work.  He was plan for coronary evaluation this Thursday however on Saturday he was out with his sister and had an acute onset episode of shortness of breath while they were at lunch followed by severe right upper extremity, back, and chest discomfort.  This lasted for several hours before eventually going away.  He had similar recurrent symptoms on Sunday when he was at home with his family and sitting down doing nothing.  The pain again was fairly severe and occurred at rest.  His family at that time strongly encouraged him to seek further evaluation in the ED.  During most these episodes he also has accompanying nausea.  He denies any orthopnea, PND, weight gain, or lower extremity edema.   IMPRESSION:Eric Bauer has normal right heart pressures and minimally obstructive CAD.  I believe that his coronary CTA overcalled the degree of obstructive disease.  Medical therapy will be recommended.  The sheath was removed and a TR band was placed on the right wrist to achieve patent hemostasis.  The patient left lab in stable condition.  Eric Bauer. MD, [facility] 08/15/2020 11:36 AM  Findings Coronary Findings Diagnostic  Dominance: Right  Left Anterior Descending Prox LAD lesion is 40% stenosed.  Intervention  No interventions have been documented.     ECHOCARDIOGRAM  ECHOCARDIOGRAM COMPLETE  06/18/2020  Narrative ECHOCARDIOGRAM REPORT    Patient Name:   Eric Bauer Date of Exam: 06/18/2020 Medical Rec #:  [id number]        Height:       68.0 in Accession #:    [id number]       Weight:       202.4 lb Date of Birth:  1965-10-02        BSA:          2.054 m Patient Age:    54 years         BP:           96/64 mmHg Patient Gender: M                HR:           91 bpm. Exam Location:  Inpatient  Procedure: 2D Echo  Indications:    chest pain. TIA.  History:        Patient has no prior history of Echocardiogram examinations. Risk Factors:Diabetes and Dyslipidemia.  Sonographer:    Dione Franks Referring Phys: [id number] Laksh S OPYD  IMPRESSIONS   1. Left ventricular ejection fraction, by estimation, is 60 to 65%. The left ventricle has normal function. The left ventricle has no regional wall motion abnormalities. Left ventricular diastolic parameters were normal. 2. Right ventricular systolic function is normal. The right ventricular size is normal. Tricuspid regurgitation signal is inadequate for assessing PA pressure. 3. The mitral valve is normal in structure. Trivial mitral valve regurgitation. No evidence of mitral stenosis. 4. The aortic valve is tricuspid. Aortic valve regurgitation is not visualized. No aortic stenosis is present. 5. The inferior vena cava is normal in size with greater than 50% respiratory variability, suggesting right atrial pressure of 3 mmHg.  FINDINGS Left Ventricle: Left ventricular ejection fraction, by estimation, is 60 to 65%. The left ventricle has normal function. The left ventricle has no regional wall motion abnormalities. The left ventricular internal cavity size was normal in size. There is no left ventricular hypertrophy. Left ventricular diastolic parameters were normal.  Right Ventricle: The right ventricular size is normal.Right ventricular systolic function is normal. Tricuspid regurgitation signal is inadequate for  assessing PA pressure. The tricuspid regurgitant velocity is 2.28 m/s, and with an assumed right atrial pressure of 3 mmHg, the estimated right ventricular systolic pressure is 23.8 mmHg.  Left Atrium: Left atrial size was normal in size.  Right Atrium: Right atrial size was normal in size.  Pericardium: There is no evidence of pericardial effusion.  Mitral Valve: The mitral valve is normal in structure. Trivial mitral valve regurgitation. No evidence of mitral valve stenosis.  Tricuspid Valve: The tricuspid valve is normal in structure. Tricuspid valve regurgitation is trivial. No evidence of tricuspid stenosis.  Aortic Valve: The aortic valve is tricuspid. Aortic valve regurgitation is not visualized. No aortic stenosis is present.  Pulmonic Valve: The pulmonic valve was normal in structure. Pulmonic valve regurgitation is not visualized. No evidence of pulmonic stenosis.  Aorta: The aortic root is normal in size and structure.  Venous: The inferior vena cava is normal in size with greater than 50% respiratory variability, suggesting right atrial pressure of 3  mmHg.  IAS/Shunts: No atrial level shunt detected by color flow Doppler.   LEFT VENTRICLE PLAX 2D LVIDd:         4.80 cm Diastology LVIDs:         2.70 cm LV e' medial:    11.60 cm/s LV PW:         1.10 cm LV E/e' medial:  6.9 LV IVS:        1.00 cm LV e' lateral:   12.80 cm/s LV E/e' lateral: 6.3   RIGHT VENTRICLE             IVC RV S prime:     18.90 cm/s  IVC diam: 1.80 cm TAPSE (M-mode): 2.3 cm  LEFT ATRIUM             Index       RIGHT ATRIUM          Index LA diam:        4.20 cm 2.04 cm/m  RA Area:     9.86 cm LA Vol (A2C):   28.8 ml 14.02 ml/m RA Volume:   17.70 ml 8.62 ml/m LA Vol (A4C):   32.0 ml 15.58 ml/m LA Biplane Vol: 31.5 ml 15.34 ml/m AORTIC VALVE LVOT Vmax:   127.00 cm/s LVOT Vmean:  80.800 cm/s LVOT VTI:    0.196 m  AORTA Ao Asc diam: 3.50 cm  MITRAL VALVE               TRICUSPID  VALVE MV Area (PHT): 5.54 cm    TR Peak grad:   20.8 mmHg MV Decel Time: 137 msec    TR Vmax:        228.00 cm/s MV E velocity: 80.20 cm/s MV A velocity: 65.50 cm/s  SHUNTS MV E/A ratio:  1.22        Systemic VTI: 0.20 m  Eric Angel MD Electronically signed by Eric Angel MD Signature Date/Time: 06/18/2020/11:30:19 AM    Final    MONITORS  CARDIAC EVENT MONITOR 07/31/2020  Narrative Sinus bradycardia, NSR, sinus tachycardia; rare PVC. Eric Bauer   CT SCANS  CT CORONARY FRACTIONAL FLOW RESERVE DATA PREP 08/04/2020  Addendum 08/07/2020  1:49 PM ADDENDUM REPORT: 08/07/2020 13:47  EXAM: OVER-READ INTERPRETATION  CT CHEST  The following report is an over-read performed by radiologist Dr. Babs Bolster [facility] on 08/07/2020. This over-read does not include interpretation of cardiac or coronary anatomy or pathology. The coronary calcium score and cardiac CTA interpretation by the cardiologist is attached.  COMPARISON:  None.  FINDINGS: Atherosclerotic calcifications in the thoracic aorta. Dilatation of the pulmonic trunk (3.9 cm in diameter). Within the visualized portions of the thorax there are no suspicious appearing pulmonary nodules or masses, there is no acute consolidative airspace disease, no pleural effusions, no pneumothorax and no lymphadenopathy. Visualized portions of the upper abdomen are unremarkable. There are no aggressive appearing lytic or blastic lesions noted in the visualized portions of the skeleton.  IMPRESSION: 1. Dilatation of the pulmonic trunk (3.9 cm in diameter), concerning for pulmonary arterial hypertension. 2. Aortic Atherosclerosis (ICD10-I70.0).   Electronically Signed By: Eric Bauer M.D. On: 08/07/2020 13:47  Narrative EXAM: CT FFR ANALYSIS  CLINICAL DATA:  abnormal cardiac CT  FINDINGS: FFRct analysis was performed on the original cardiac CT angiogram dataset. Diagrammatic  representation of the FFRct analysis is provided in a separate PDF document in PACS. This dictation was created using the PDF document and an interactive 3D model of the  results. 3D model is not available in the EMR/PACS. Normal FFR range is >0.80. Indeterminate (grey) zone is 0.76-0.80.  1. Left Main: FFR = 0.97  2. LAD: Proximal FFR = 0.92, Mid FFR = 0.85, Distal FFR = 0.73 3. LCX: Proximal FFR = 0.98, distal FFR = 0.89 4. RCA: Proximal FFR = 0.96, Mid FFR =0.92, Distal FFR = 0.86  IMPRESSION: 1. CT FFR analysis showed significant stenosis in the distal LAD, consistent with the severe focal stenosis in the distal LAD noted on CCTA. This is a small caliber portion of the vessel.  RECOMMENDATIONS: Guideline-directed medical therapy and aggressive risk factor modification for secondary prevention of coronary artery disease. If symptoms persist, consider coronary angiography given significant calcifications in LAD with images subject to artifact.  Electronically Signed: By: Weston Brass On: 08/04/2020 17:02   CT CORONARY MORPH W/CTA COR W/SCORE 08/04/2020  Narrative HISTORY: Pericardial disease suspected  EXAM: Cardiac/Coronary  CT  TECHNIQUE: The patient was scanned on a [employer].  PROTOCOL: A 120 kV prospective scan was triggered in the descending thoracic aorta at 111 HU's. Axial non-contrast 3 mm slices were carried out through the heart. The data set was analyzed on a dedicated work station and scored using the Agatston method. Gantry rotation speed was 250 msecs and collimation was .6 mm. Beta blockade and 0.8 mg of sl NTG was given. The 3D data set was reconstructed in 5% intervals of the 35-75 % of the R-R cycle. Systolic and diastolic phases were analyzed on a dedicated work station using MPR, MIP and VRT modes. The patient received OMNIPAQUE IOHEXOL 350 MG/ML SOLN contrast.  FINDINGS: Image quality: Average  Noise artifact is:  Moderate, reduced signal to noise ratio.  Coronary calcium score is 1693, which places the patient in the 99th percentile for age and sex matched control.  Coronary arteries: Normal coronary origins.  Right dominance.  Right Coronary Artery: Serial mild mixed atherosclerotic plaque in the proximal RCA, 25-49% stenosis. Mild mixed atherosclerotic plaque in the mid and distal RCA, 25-49% stenosis. Patent PDA and PLA.  Left Main Coronary Artery: Minimal mixed atherosclerotic plaque in the distal LM, <25% stenosis.  Left Anterior Descending Coronary Artery: Diffusely diseased ostial to proximal LAD. Serial moderate mixed atherosclerotic stenoses in the proximal and mid LAD, 50-69% stenosis. Image quality limits precise assessment of luminal stenosis. Probable severe stenosis in the distal LAD 70-99% stenosis.  Left Circumflex Artery: Mild scattered mixed atherosclerotic plaque in the proximal L circumflex, 25-49% stenosis.  Aorta: Borderline dilation, 38 mm at the mid ascending aorta (level of the PA bifurcation), and 37 x 37 x 37 mm at the sinus of Valsalva, measured double oblique. No dissection, trivial calcifications.  Aortic Valve: No calcifications.  Other findings:  Normal pulmonary vein drainage into the left atrium.  Normal left atrial appendage without a thrombus.  Severe dilation of main pulmonary artery, 40 mm, suggestive of pulmonary hypertension.  IMPRESSION: 1. Moderate-severe CAD in the proximal, mid and distal LAD, CADRADS = 4. CT FFR will be performed and reported separately.  2. Coronary calcium score is 1693, which places the patient in the 99th percentile for age and sex matched control.  3. Normal coronary origin with right dominance.  4. Severe dilation of main pulmonary artery, 40 mm, suggestive of pulmonary hypertension.  5. Borderline dilation of ascending aorta, 38 mm at mid ascending aorta.   Electronically Signed By: Weston Brass On: 08/04/2020 15:47     ______________________________________________________________________________________________  Current Reported Medications:.    No outpatient medications have been marked as taking for the 06/16/23 encounter (Appointment) with Ajani Rineer D, NP.    Physical Exam:    VS:  There were no vitals taken for this visit.   Wt Readings from Last 3 Encounters:  03/20/23 183 lb (83 kg)  07/02/22 205 lb (93 kg)  06/25/22 199 lb 6.4 oz (90.4 kg)    GEN: Well nourished, well developed in no acute distress NECK: No JVD; No carotid bruits CARDIAC: ***RRR, no murmurs, rubs, gallops RESPIRATORY:  Clear to auscultation without rales, wheezing or rhonchi  ABDOMEN: Soft, non-tender, non-distended EXTREMITIES:  No edema; No acute deformity     Asessement and Plan:.     ***     Disposition: F/u with ***  Signed, Rodolphe Edmonston D Modene Andy, NP

## 2023-06-16 ENCOUNTER — Ambulatory Visit: Payer: BC Managed Care – PPO | Admitting: Cardiology

## 2023-07-25 DIAGNOSIS — E538 Deficiency of other specified B group vitamins: Secondary | ICD-10-CM | POA: Diagnosis not present

## 2023-07-25 DIAGNOSIS — Z125 Encounter for screening for malignant neoplasm of prostate: Secondary | ICD-10-CM | POA: Diagnosis not present

## 2023-07-25 DIAGNOSIS — Z1329 Encounter for screening for other suspected endocrine disorder: Secondary | ICD-10-CM | POA: Diagnosis not present

## 2023-07-25 DIAGNOSIS — E782 Mixed hyperlipidemia: Secondary | ICD-10-CM | POA: Diagnosis not present

## 2023-07-25 DIAGNOSIS — E1165 Type 2 diabetes mellitus with hyperglycemia: Secondary | ICD-10-CM | POA: Diagnosis not present

## 2023-07-25 DIAGNOSIS — E559 Vitamin D deficiency, unspecified: Secondary | ICD-10-CM | POA: Diagnosis not present
